# Patient Record
Sex: Male | Born: 1935 | Race: Black or African American | Hispanic: No | Smoking: Current every day smoker
Health system: Southern US, Community
[De-identification: ages and names within clinical notes are randomized; demographics above are authoritative.]

## PROBLEM LIST (undated history)

## (undated) DIAGNOSIS — N400 Enlarged prostate without lower urinary tract symptoms: Secondary | ICD-10-CM

## (undated) DIAGNOSIS — E785 Hyperlipidemia, unspecified: Secondary | ICD-10-CM

## (undated) DIAGNOSIS — J449 Chronic obstructive pulmonary disease, unspecified: Secondary | ICD-10-CM

## (undated) DIAGNOSIS — F0390 Unspecified dementia without behavioral disturbance: Secondary | ICD-10-CM

## (undated) DIAGNOSIS — I10 Essential (primary) hypertension: Secondary | ICD-10-CM

## (undated) HISTORY — DX: Benign prostatic hyperplasia without lower urinary tract symptoms: N40.0

## (undated) HISTORY — DX: Chronic obstructive pulmonary disease, unspecified: J44.9

## (undated) HISTORY — DX: Essential (primary) hypertension: I10

## (undated) HISTORY — DX: Hyperlipidemia, unspecified: E78.5

---

## 1999-02-24 ENCOUNTER — Encounter: Payer: Self-pay | Admitting: Emergency Medicine

## 1999-02-24 ENCOUNTER — Emergency Department (HOSPITAL_COMMUNITY): Admission: EM | Admit: 1999-02-24 | Discharge: 1999-02-24 | Payer: Self-pay | Admitting: Emergency Medicine

## 1999-12-24 ENCOUNTER — Emergency Department (HOSPITAL_COMMUNITY): Admission: EM | Admit: 1999-12-24 | Discharge: 1999-12-24 | Payer: Self-pay | Admitting: Emergency Medicine

## 1999-12-24 ENCOUNTER — Encounter: Payer: Self-pay | Admitting: Emergency Medicine

## 2000-12-25 ENCOUNTER — Encounter: Admission: RE | Admit: 2000-12-25 | Discharge: 2000-12-25 | Payer: Self-pay | Admitting: Internal Medicine

## 2000-12-25 ENCOUNTER — Encounter: Payer: Self-pay | Admitting: Internal Medicine

## 2001-04-30 ENCOUNTER — Ambulatory Visit (HOSPITAL_COMMUNITY): Admission: RE | Admit: 2001-04-30 | Discharge: 2001-04-30 | Payer: Self-pay | Admitting: Gastroenterology

## 2001-04-30 ENCOUNTER — Encounter (INDEPENDENT_AMBULATORY_CARE_PROVIDER_SITE_OTHER): Payer: Self-pay | Admitting: Specialist

## 2004-01-20 ENCOUNTER — Ambulatory Visit (HOSPITAL_COMMUNITY): Admission: RE | Admit: 2004-01-20 | Discharge: 2004-01-20 | Payer: Self-pay | Admitting: Gastroenterology

## 2004-05-10 ENCOUNTER — Encounter: Admission: RE | Admit: 2004-05-10 | Discharge: 2004-05-10 | Payer: Self-pay | Admitting: Internal Medicine

## 2006-10-14 ENCOUNTER — Encounter: Admission: RE | Admit: 2006-10-14 | Discharge: 2006-10-14 | Payer: Self-pay | Admitting: Internal Medicine

## 2007-02-23 ENCOUNTER — Emergency Department (HOSPITAL_COMMUNITY): Admission: EM | Admit: 2007-02-23 | Discharge: 2007-02-23 | Payer: Self-pay | Admitting: Emergency Medicine

## 2008-09-06 ENCOUNTER — Emergency Department (HOSPITAL_COMMUNITY): Admission: EM | Admit: 2008-09-06 | Discharge: 2008-09-06 | Payer: Self-pay | Admitting: Emergency Medicine

## 2010-09-20 LAB — BASIC METABOLIC PANEL WITH GFR
BUN: 16 mg/dL (ref 6–23)
Calcium: 9.1 mg/dL (ref 8.4–10.5)
Chloride: 108 meq/L (ref 96–112)
Creatinine, Ser: 1.04 mg/dL (ref 0.4–1.5)
GFR calc Af Amer: >60 mL/min (ref >60)
GFR calc non Af Amer: >60 mL/min (ref >60)

## 2010-09-20 LAB — CBC
HCT: 46.2 % (ref 39.0–52.0)
Hemoglobin: 15.6 g/dL (ref 13.0–17.0)
MCHC: 33.8 g/dL (ref 30.0–36.0)
MCV: 92.9 fL (ref 78.0–100.0)
Platelets: 146 K/uL — ABNORMAL LOW (ref 150–400)
RBC: 4.97 MIL/uL (ref 4.22–5.81)
RDW: 14.1 % (ref 11.5–15.5)
WBC: 4.6 K/uL (ref 4.0–10.5)

## 2010-09-20 LAB — BASIC METABOLIC PANEL
CO2: 25 mEq/L (ref 19–32)
Glucose, Bld: 106 mg/dL — ABNORMAL HIGH (ref 70–99)
Potassium: 3.7 mEq/L (ref 3.5–5.1)
Sodium: 139 mEq/L (ref 135–145)

## 2010-09-20 LAB — DIFFERENTIAL
Basophils Absolute: 0 10*3/uL (ref 0.0–0.1)
Basophils Relative: 1 % (ref 0–1)
Eosinophils Absolute: 0.2 K/uL (ref 0.0–0.7)
Eosinophils Relative: 5 % (ref 0–5)
Lymphocytes Relative: 35 % (ref 12–46)
Lymphs Abs: 1.6 K/uL (ref 0.7–4.0)
Monocytes Absolute: 0.2 10*3/uL (ref 0.1–1.0)
Monocytes Relative: 5 % (ref 3–12)
Neutro Abs: 2.5 K/uL (ref 1.7–7.7)
Neutrophils Relative %: 54 % (ref 43–77)

## 2010-10-26 NOTE — Procedures (Signed)
[facility]  Patient:    Clarence Padilla, Clarence Padilla Visit Number: [id number] MRN: [id number]          Service Type: END Location: ENDO Attending Physician:  Rich Brave Dictated by:   Florencia Reasons, M.D. Proc. Date: 04/30/01 Admit Date:  04/30/2001   CC:         Eric L. August Saucer, M.D.   Procedure Report  PROCEDURE:  Colonoscopy with polypectomy.  SURGEON:  Florencia Reasons, M.D.  INDICATIONS:  A 77 year old for colon cancer screening.  FINDINGS:  Multiple polyps removed.  DESCRIPTION OF PROCEDURE:  The nature, purpose, and risks of the procedure had been discussed with the patient who provided written consent.  Sedation was fentanyl 75 mcg and Versed 9 mg IV without arrhythmias or desaturation. Digital exam of the prostate was unremarkable.  The Olympus adult video colonoscope was easily advanced to the cecum as identified by visualization of the ileocecal valve and the absence of further lumen.  Pull back was then performed.  The quality of the prep was very good except for some adherent stool film in the cecum and proximal ascending colon which is not felt to have obscured any lesions.  Retroflexion was not performed in the rectum.  No colitis, vascular malformations, diverticular disease, or evidence of colon cancer were observed during this exam.  Six polyps were identified and removed during this exam.  They are as follows; a 6 x 8 mm semi-pedunculated polyp snared at the base of the cecum, a 6 x 6 mm semi-pedunculated polyp snared in the proximal ascending colon just above the cecum, a 1.8 cm polyp injected at its base and then snared in the mid transverse colon, a 5 mm semi-pedunculated polyp snared near the splenic flexure, a 3 mm polyp biopsied at about 35 cm or so, and a 3 mm polyp biopsied at 20 cm.  At all polypectomy sites, there was very good or complete hemostasis, and no evidence of excessive cautery.  The patient tolerated  the procedure well and there were no apparent complications.  IMPRESSION:  Multiple colon polyps, removed as described above.  PLAN:  Await pathology.  In view of the large number of polyps and the fact that one of them was fairly large, I would favor earlier-than-usual colonoscopic followup, probably two years from now. Dictated by:   Florencia Reasons, M.D. Attending Physician:  Rich Brave DD:  04/30/01 TD:  05/01/01 Job: [id number]

## 2011-12-18 ENCOUNTER — Emergency Department (HOSPITAL_COMMUNITY)
Admission: EM | Admit: 2011-12-18 | Discharge: 2011-12-18 | Disposition: A | Discharge status: Home / Self Care | Payer: Medicare Other | Attending: Emergency Medicine | Admitting: Emergency Medicine

## 2011-12-18 ENCOUNTER — Encounter (HOSPITAL_COMMUNITY): Payer: Self-pay

## 2011-12-18 DIAGNOSIS — L720 Epidermal cyst: Secondary | ICD-10-CM

## 2011-12-18 DIAGNOSIS — L723 Sebaceous cyst: Secondary | ICD-10-CM | POA: Insufficient documentation

## 2011-12-18 NOTE — ED Notes (Signed)
Pt here for abscess to back with head noted to same, sts hx of same and at home burst but now has came back.

## 2011-12-18 NOTE — ED Provider Notes (Signed)
History    This chart was scribed for Clarence Cellar, MD, MD by Smitty Pluck. The patient was seen in room TR11C and the patient's care was started at 1:37PM.   CSN: [id number]  Arrival date & time 12/18/11  1117   First MD Initiated Contact with Patient 12/18/11 1324      Chief Complaint  Patient presents with  . Abscess    (Consider location/radiation/quality/duration/timing/severity/associated sxs/prior treatment) Patient is a 78 y.o. male presenting with abscess. The history is provided by the patient.  Abscess    MAKAI DUMOND is a 78 y.o. male who presents to the Emergency Department complaining of moderate cyst  that has been there for years. Pt reports that if he does not have pain. He states that the abscess is irritated when he sit back in car seat. He reports it itches. Denies fevers, chills, n/v/d. Denies any current pain. Denies any other symptoms. Pt reports that he noticed it has gotten bigger 2-3 weeks ago "and looks like it's coming to a head". Reports PCP told him to come to ED if he had any problems with abscess. Reports that he has had PCP drain it in the past but it has come back. Denies radiation.  PCP is Dr. Willey Blade     Joice Lofts, RN 12/18/2011 11:26    Pt here for abscess to back with head noted to same, sts hx of same and at home burst but now has came back.    History reviewed. No pertinent past medical history.  No past surgical history on file.  No family history on file.  History  Substance Use Topics  . Smoking status: Not on file  . Smokeless tobacco: Not on file  . Alcohol Use: Not on file      Review of Systems  All other systems reviewed and are negative.  10 Systems reviewed and all are negative for acute change except as noted in the HPI.    Allergies  Review of patient's allergies indicates no known allergies.  Home Medications   Current Outpatient Rx  Name Route Sig Dispense Refill  . LOVASTATIN 40 MG PO TABS Oral  Take 40 mg by mouth at bedtime.    . TAMSULOSIN HCL 0.4 MG PO CAPS Oral Take 0.4 mg by mouth daily.      BP 123/67  Pulse 96  Temp 97.4 F (36.3 C) (Oral)  Resp 16  SpO2 99%  Physical Exam  Nursing note and vitals reviewed. Constitutional: He is oriented to person, place, and time. He appears well-developed and well-nourished. No distress.  HENT:  Head: Normocephalic and atraumatic.  Eyes: Conjunctivae are normal.  Cardiovascular: Normal rate, regular rhythm and normal heart sounds.   Pulmonary/Chest: Effort normal and breath sounds normal. No respiratory distress.  Abdominal: Soft. He exhibits no distension. There is no tenderness.  Neurological: He is alert and oriented to person, place, and time.  Skin: Skin is warm and dry.       5x3 cm cyst on back   minimal erythema  no induration No ttp Small pustule without drainage  Psychiatric: He has a normal mood and affect. His behavior is normal.    ED Course  Procedures (including critical care time) DIAGNOSTIC STUDIES: Oxygen Saturation is 99% on room air, normal by my interpretation.    COORDINATION OF CARE:  Labs Reviewed - No data to display No results found.   1. Cyst of skin and subcutaneous tissue  MDM  Back cyst v/s lipoma. No signs of cellulitis/abscess including no fever, induration, NO TTP (or pain). Pt was, however given option for I&D and he declined. Will f/u with his PMD, Dr. August Saucer. No EMC precluding discharge at this time. Given Precautions for return. PMD f/u.   I personally performed the services described in this documentation, which was scribed in my presence. The recorded information has been reviewed and considered.     Clarence Cellar, MD 12/18/11 1408

## 2012-02-17 LAB — HEPATIC FUNCTION PANEL
ALT: 12 U/L (ref 10–40)
Alkaline Phosphatase: 64 U/L (ref 25–125)

## 2012-02-17 LAB — BASIC METABOLIC PANEL
Creatinine: 0.9 mg/dL (ref 0.6–1.3)
Potassium: 4.2 mmol/L (ref 3.4–5.3)

## 2012-07-15 DIAGNOSIS — E785 Hyperlipidemia, unspecified: Secondary | ICD-10-CM

## 2012-07-15 HISTORY — DX: Hyperlipidemia, unspecified: E78.5

## 2012-08-10 ENCOUNTER — Encounter: Payer: Self-pay | Admitting: Hematology

## 2012-08-10 DIAGNOSIS — E785 Hyperlipidemia, unspecified: Secondary | ICD-10-CM | POA: Insufficient documentation

## 2013-04-14 ENCOUNTER — Other Ambulatory Visit: Payer: Self-pay | Admitting: Ophthalmology

## 2013-04-14 NOTE — H&P (Signed)
Patient Record  Ferris, Amin  Patient Number:  [id number] Date of Birth:  July 26, 1933 Age:  79 years old    Gender:  male Date of Evaluation:  April 14, 2013  Chief Complaint:   The patient is referred for consultataive evaluation regarding Cataracts OS>OD. History of Present Illness:   80 yo male discovered x 1 week ago that he could not see from the OS.  Has difficulty seeing the television.  Also has difficulty seeing to drive.  Not able to see to drive at night.  Has itching os, but no pain.  Has difficulty for distgance and near OS. ( Reviewed by Doctor: GG) Past History:  Allergies:  None., Active Medications:   Other Medications:  None. Birth History:  none Past Ocular History:  none Past Medical History:   Elevated Cholesterol Past Surgical History:   hernia repair  Family History:  no amblyopia, no blindness, no cataracts, + crossed eyes (sister), no diabetic retinopathy, no glaucoma, no macular degeneration, no retinal detachment, + cancer (sister (2), brother (2)), no diabetes, + heart disease (brother), + high blood pressure (brother), no stroke Social History:   Smoking Status: current every day smoker  Alcohol:  none   Driving status:  driving Marital status:  married Review of Systems:   Eyes: + itching (left), + decreased vision  All other systems are negative.  Examination:  Visual Acuity:   Distance [facility] [place]:  OD: 20/200    OS: 20/400 IOP:  OD:  14     OS:  14    @ 04:02PM (Goldmann applanation) Manifest Refraction:    Sphere    Cyl Axis       VA         Add       VA Prism Base R:  -1.25                20/30                                  L: Cannot Refract because of dense Cataract Left Eye                                                       comments: 04/05/13 Will get glasses after surgery.  Confrontation visual field:  OU:  Normal  Motility:  OU:  Normal  Pupils:  OU:  Shape, size, direct and consensual reaction normal  Adnexa:  Preauricular  LN, lacrimal drainage, lacrimal glands, orbit normal  Eyelids:  R:  normal L:  inferior lid laxity with modest ectropion Conjunctiva: OU:  bulbar, palpebral normal  Cornea: OU:  epithelium, stroma, endothelium, tear film normal  Anterior Chamber: OU:  depth normal, no cell, no flare, 3+ deep / clear  Iris: OU:  normal Dilation:  OU: Tropicamide 1%/ N 2.5% @ 04:14PM  Lens:  OD:  2+ nuclear sclerosis, 3+ cortical cataract OS:  3+ nuclear sclerosis, 3+ cortical, 3+ posterior subcapsular cataract, 2+ anterior subcapsular Cataract  Vitreous: OU:  normal  Optic Disc: OD:  cupping: 0.25   OS:  cupping: hazy view  Macula:  OD:  normal  OS:  poor view  Vessels:  OD:  normal  OS:  poor view  Periphery:  OD:    normal  OS:  poor view  A Scan / IOLMaster:  AScan predicts +21.00 Diopter Acrysof MA50BM PC IOL OS for emmetropia   Orientation to person, place and time:  Normal  Mood and affect:  Normal  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 We discussed the related symptoms , visual significance and when we intervene with surgery. We discussed the surgical techniques used, risks and benefits of surgery.  He has both an anterior and posterior subcapsulr Cataract OS which accounts for the poor fundus view. I have recommended proceeding with Phaco IOL OS first.  He indicated understanding our discussion and felt that his questions had been answered to his satisfaction.  He indicates that he desires to proceed with Pahco IOL OS.  Patient Instructions: Please do not eat anything after mignight the day before surgery.  Return to clinic:  December 4th, 2014 for post-operative follow-up   Schedule:  Phacoemulsification, Posterior Chamber Intraocular Lens Left Eye x 05/12/2013   (electronically signed)  Santonio Speakman, MD   

## 2013-05-11 ENCOUNTER — Other Ambulatory Visit (HOSPITAL_COMMUNITY): Payer: Self-pay | Admitting: *Deleted

## 2013-05-11 MED ORDER — CYCLOPENTOLATE HCL 1 % OP SOLN
1.0000 [drp] | OPHTHALMIC | Status: AC | PRN
  Administered 2013-05-12 (×3): 1 [drp] via OPHTHALMIC
  Filled 2013-05-11: qty 6

## 2013-05-11 MED ORDER — PHENYLEPHRINE HCL 2.5 % OP SOLN
1.0000 [drp] | OPHTHALMIC | Status: AC | PRN
  Administered 2013-05-12 (×3): 1 [drp] via OPHTHALMIC

## 2013-05-11 MED ORDER — GATIFLOXACIN 0.5 % OP SOLN
1.0000 [drp] | OPHTHALMIC | Status: AC | PRN
  Administered 2013-05-12 (×3): 1 [drp] via OPHTHALMIC
  Filled 2013-05-11: qty 7.5

## 2013-05-11 MED ORDER — TETRACAINE HCL 0.5 % OP SOLN
2.0000 [drp] | OPHTHALMIC | Status: AC
  Administered 2013-05-12: 2 [drp] via OPHTHALMIC
  Filled 2013-05-11: qty 4

## 2013-05-11 MED ORDER — PREDNISOLONE ACETATE 1 % OP SUSP
1.0000 [drp] | OPHTHALMIC | Status: AC
  Administered 2013-05-12: 09:00:00 via OPHTHALMIC
  Administered 2013-05-12: 1 [drp] via OPHTHALMIC
  Filled 2013-05-11: qty 5

## 2013-05-12 ENCOUNTER — Ambulatory Visit (HOSPITAL_COMMUNITY)
Admission: RE | Admit: 2013-05-12 | Discharge: 2013-05-12 | Disposition: A | Discharge status: Home / Self Care | Payer: Medicare Other | Source: Ambulatory Visit | Attending: Ophthalmology | Admitting: Ophthalmology

## 2013-05-12 ENCOUNTER — Ambulatory Visit (HOSPITAL_COMMUNITY): Payer: Medicare Other

## 2013-05-12 ENCOUNTER — Ambulatory Visit (HOSPITAL_COMMUNITY): Payer: Medicare Other | Admitting: Anesthesiology

## 2013-05-12 ENCOUNTER — Encounter (HOSPITAL_COMMUNITY): Payer: Medicare Other | Admitting: Anesthesiology

## 2013-05-12 ENCOUNTER — Encounter (HOSPITAL_COMMUNITY)
Admission: RE | Disposition: A | Discharge status: Home / Self Care | Payer: Self-pay | Source: Ambulatory Visit | Attending: Ophthalmology

## 2013-05-12 ENCOUNTER — Encounter (HOSPITAL_COMMUNITY): Payer: Self-pay | Admitting: Anesthesiology

## 2013-05-12 DIAGNOSIS — F17200 Nicotine dependence, unspecified, uncomplicated: Secondary | ICD-10-CM | POA: Insufficient documentation

## 2013-05-12 DIAGNOSIS — J449 Chronic obstructive pulmonary disease, unspecified: Secondary | ICD-10-CM | POA: Insufficient documentation

## 2013-05-12 DIAGNOSIS — J4489 Other specified chronic obstructive pulmonary disease: Secondary | ICD-10-CM | POA: Insufficient documentation

## 2013-05-12 DIAGNOSIS — H25049 Posterior subcapsular polar age-related cataract, unspecified eye: Secondary | ICD-10-CM | POA: Insufficient documentation

## 2013-05-12 DIAGNOSIS — E7800 Pure hypercholesterolemia, unspecified: Secondary | ICD-10-CM | POA: Insufficient documentation

## 2013-05-12 DIAGNOSIS — H25039 Anterior subcapsular polar age-related cataract, unspecified eye: Secondary | ICD-10-CM | POA: Insufficient documentation

## 2013-05-12 DIAGNOSIS — IMO0002 Reserved for concepts with insufficient information to code with codable children: Secondary | ICD-10-CM | POA: Insufficient documentation

## 2013-05-12 DIAGNOSIS — I10 Essential (primary) hypertension: Secondary | ICD-10-CM | POA: Insufficient documentation

## 2013-05-12 DIAGNOSIS — H2510 Age-related nuclear cataract, unspecified eye: Secondary | ICD-10-CM | POA: Insufficient documentation

## 2013-05-12 HISTORY — PX: CATARACT EXTRACTION W/PHACO: SHX586

## 2013-05-12 LAB — CBC
HCT: 47.2 % (ref 39.0–52.0)
Hemoglobin: 16.3 g/dL (ref 13.0–17.0)
MCH: 31.7 pg (ref 26.0–34.0)
MCHC: 34.5 g/dL (ref 30.0–36.0)
WBC: 6.8 10*3/uL (ref 4.0–10.5)

## 2013-05-12 SURGERY — PHACOEMULSIFICATION, CATARACT, WITH IOL INSERTION
Anesthesia: Monitor Anesthesia Care | Site: Eye | Laterality: Left

## 2013-05-12 MED ORDER — DEXAMETHASONE SODIUM PHOSPHATE 10 MG/ML IJ SOLN
INTRAMUSCULAR | Status: AC
  Filled 2013-05-12: qty 1

## 2013-05-12 MED ORDER — LIDOCAINE HCL 2 % IJ SOLN
INTRAMUSCULAR | Status: AC
  Filled 2013-05-12: qty 20

## 2013-05-12 MED ORDER — BUPIVACAINE HCL (PF) 0.75 % IJ SOLN
INTRAMUSCULAR | Status: AC
  Filled 2013-05-12: qty 10

## 2013-05-12 MED ORDER — EPINEPHRINE HCL 1 MG/ML IJ SOLN
INTRAMUSCULAR | Status: AC
  Filled 2013-05-12: qty 1

## 2013-05-12 MED ORDER — HYPROMELLOSE (GONIOSCOPIC) 2.5 % OP SOLN
OPHTHALMIC | Status: DC | PRN
  Administered 2013-05-12: 2 [drp] via OPHTHALMIC

## 2013-05-12 MED ORDER — LIDOCAINE HCL 2 % IJ SOLN
INTRAMUSCULAR | Status: DC | PRN
  Administered 2013-05-12: 11:00:00 via RETROBULBAR

## 2013-05-12 MED ORDER — TRYPAN BLUE 0.15 % OP SOLN
OPHTHALMIC | Status: DC | PRN
  Administered 2013-05-12: 0.5 mL via INTRAVITREAL

## 2013-05-12 MED ORDER — NA CHONDROIT SULF-NA HYALURON 40-30 MG/ML IO SOLN
INTRAOCULAR | Status: AC
  Filled 2013-05-12: qty 0.5

## 2013-05-12 MED ORDER — PHENYLEPHRINE HCL 2.5 % OP SOLN
OPHTHALMIC | Status: AC
  Filled 2013-05-12: qty 15

## 2013-05-12 MED ORDER — CEFAZOLIN SUBCONJUNCTIVAL INJECTION 100 MG/0.5 ML
INJECTION | SUBCONJUNCTIVAL | Status: DC | PRN
  Administered 2013-05-12: 100 mg via SUBCONJUNCTIVAL

## 2013-05-12 MED ORDER — DEXAMETHASONE SODIUM PHOSPHATE 10 MG/ML IJ SOLN
INTRAMUSCULAR | Status: DC | PRN
  Administered 2013-05-12: 10 mg

## 2013-05-12 MED ORDER — PROVISC 10 MG/ML IO SOLN
INTRAOCULAR | Status: DC | PRN
  Administered 2013-05-12: 0.85 mL via INTRAOCULAR

## 2013-05-12 MED ORDER — SODIUM CHLORIDE 0.9 % IV SOLN
INTRAVENOUS | Status: DC | PRN
  Administered 2013-05-12: 09:00:00 via INTRAVENOUS

## 2013-05-12 MED ORDER — HYPROMELLOSE (GONIOSCOPIC) 2.5 % OP SOLN
OPHTHALMIC | Status: AC
  Filled 2013-05-12: qty 15

## 2013-05-12 MED ORDER — EPINEPHRINE HCL 1 MG/ML IJ SOLN
INTRAOCULAR | Status: DC | PRN
  Administered 2013-05-12: 11:00:00

## 2013-05-12 MED ORDER — PROPOFOL 10 MG/ML IV BOLUS
INTRAVENOUS | Status: DC | PRN
  Administered 2013-05-12: 50 mg via INTRAVENOUS
  Administered 2013-05-12: 10 mg via INTRAVENOUS

## 2013-05-12 MED ORDER — SODIUM HYALURONATE 10 MG/ML IO SOLN
INTRAOCULAR | Status: AC
  Filled 2013-05-12: qty 0.85

## 2013-05-12 MED ORDER — NA CHONDROIT SULF-NA HYALURON 40-30 MG/ML IO SOLN
INTRAOCULAR | Status: DC | PRN
  Administered 2013-05-12: 0.5 mL via INTRAOCULAR

## 2013-05-12 MED ORDER — BACITRACIN-POLYMYXIN B 500-10000 UNIT/GM OP OINT
TOPICAL_OINTMENT | OPHTHALMIC | Status: DC | PRN
  Administered 2013-05-12: 1 via OPHTHALMIC

## 2013-05-12 MED ORDER — CEFAZOLIN SUBCONJUNCTIVAL INJECTION 100 MG/0.5 ML
100.0000 mg | INJECTION | SUBCONJUNCTIVAL | Status: DC
  Filled 2013-05-12: qty 0.5

## 2013-05-12 MED ORDER — SODIUM CHLORIDE 0.9 % IV SOLN
INTRAVENOUS | Status: DC
  Administered 2013-05-12: 10:00:00 via INTRAVENOUS

## 2013-05-12 MED ORDER — BSS IO SOLN
INTRAOCULAR | Status: AC
  Filled 2013-05-12: qty 500

## 2013-05-12 MED ORDER — BACITRACIN-POLYMYXIN B 500-10000 UNIT/GM OP OINT
TOPICAL_OINTMENT | OPHTHALMIC | Status: AC
  Filled 2013-05-12: qty 3.5

## 2013-05-12 MED ORDER — 0.9 % SODIUM CHLORIDE (POUR BTL) OPTIME
TOPICAL | Status: DC | PRN
  Administered 2013-05-12: 1000 mL

## 2013-05-12 MED ORDER — ONDANSETRON HCL 4 MG/2ML IJ SOLN
INTRAMUSCULAR | Status: DC | PRN
  Administered 2013-05-12: 4 mg via INTRAVENOUS

## 2013-05-12 MED ORDER — TRYPAN BLUE 0.15 % OP SOLN
0.5000 mL | OPHTHALMIC | Status: DC
  Filled 2013-05-12: qty 0.5

## 2013-05-12 SURGICAL SUPPLY — 54 items
APL SRG 3 HI ABS STRL LF PLS (MISCELLANEOUS) ×1
APPLICATOR COTTON TIP 6IN STRL (MISCELLANEOUS) ×2 IMPLANT
APPLICATOR DR MATTHEWS STRL (MISCELLANEOUS) ×2 IMPLANT
BLADE KERATOME 2.75 (BLADE) ×2 IMPLANT
BLADE STAB KNIFE 15DEG (BLADE) IMPLANT
COVER MAYO STAND STRL (DRAPES) ×2 IMPLANT
DRAPE OPHTHALMIC 77X100 STRL (CUSTOM PROCEDURE TRAY) ×2 IMPLANT
DRAPE POUCH INSTRU U-SHP 10X18 (DRAPES) ×2 IMPLANT
DRSG TEGADERM 4X4.75 (GAUZE/BANDAGES/DRESSINGS) ×3 IMPLANT
FILTER BLUE MILLIPORE (MISCELLANEOUS) IMPLANT
GLOVE ORTHO TXT STRL SZ7.5 (GLOVE) ×1 IMPLANT
GLOVE SS BIOGEL STRL SZ 6.5 (GLOVE) ×1 IMPLANT
GLOVE SUPERSENSE BIOGEL SZ 6.5 (GLOVE) ×1
GLOVE SURG SS PI 7.0 STRL IVOR (GLOVE) ×1 IMPLANT
GOWN SRG XL XLNG 56XLVL 4 (GOWN DISPOSABLE) ×1 IMPLANT
GOWN STRL NON-REIN LRG LVL3 (GOWN DISPOSABLE) ×2 IMPLANT
GOWN STRL NON-REIN XL XLG LVL4 (GOWN DISPOSABLE) ×2
KIT BASIN OR (CUSTOM PROCEDURE TRAY) ×2 IMPLANT
KIT ROOM TURNOVER OR (KITS) ×2 IMPLANT
KNIFE GRIESHABER SHARP 2.5MM (MISCELLANEOUS) ×2 IMPLANT
LENS IOL ACRYSOF MP POST 21.0 (Intraocular Lens) ×1 IMPLANT
MASK EYE SHIELD (GAUZE/BANDAGES/DRESSINGS) ×1 IMPLANT
NDL 25GX 5/8IN NON SAFETY (NEEDLE) ×2 IMPLANT
NDL FILTER BLUNT 18X1 1/2 (NEEDLE) IMPLANT
NDL HYPO 30X.5 LL (NEEDLE) ×2 IMPLANT
NEEDLE 22X1 1/2 (OR ONLY) (NEEDLE) IMPLANT
NEEDLE 25GX 5/8IN NON SAFETY (NEEDLE) ×4 IMPLANT
NEEDLE FILTER BLUNT 18X 1/2SAF (NEEDLE)
NEEDLE FILTER BLUNT 18X1 1/2 (NEEDLE) IMPLANT
NEEDLE HYPO 30X.5 LL (NEEDLE) ×4 IMPLANT
NS IRRIG 1000ML POUR BTL (IV SOLUTION) ×2 IMPLANT
PACK CATARACT CUSTOM (CUSTOM PROCEDURE TRAY) ×2 IMPLANT
PAD ARMBOARD 7.5X6 YLW CONV (MISCELLANEOUS) ×2 IMPLANT
PAD EYE OVAL STERILE LF (GAUZE/BANDAGES/DRESSINGS) ×1 IMPLANT
PAK PIK CVS CATARACT (OPHTHALMIC) ×2 IMPLANT
PROBE ANTERIOR 20G W/INFUS NDL (MISCELLANEOUS) IMPLANT
SHUTTLE MONARCH TYPE A (NEEDLE) ×2 IMPLANT
SPEAR EYE SURG WECK-CEL (MISCELLANEOUS) IMPLANT
SUT ETHILON 10-0 CS-B-6CS-B-6 (SUTURE)
SUT ETHILON 5 0 P 3 18 (SUTURE)
SUT ETHILON 9 0 TG140 8 (SUTURE) IMPLANT
SUT NYLON ETHILON 5-0 P-3 1X18 (SUTURE) IMPLANT
SUT PLAIN 6 0 TG1408 (SUTURE) IMPLANT
SUT POLY NON ABSORB 10-0 8 STR (SUTURE) IMPLANT
SUT VICRYL 6 0 S 29 12 (SUTURE) IMPLANT
SUTURE EHLN 10-0 CS-B-6CS-B-6 (SUTURE) IMPLANT
SYR 20CC LL (SYRINGE) IMPLANT
SYR TB 1ML LUER SLIP (SYRINGE) IMPLANT
SYRINGE 10CC LL (SYRINGE) IMPLANT
TIP ABS 45DEG FLARED 0.9MM (TIP) ×2 IMPLANT
TOWEL OR 17X24 6PK STRL BLUE (TOWEL DISPOSABLE) ×4 IMPLANT
WATER STERILE IRR 1000ML POUR (IV SOLUTION) ×2 IMPLANT
WIPE INSTRUMENT ADHESIVE BACK (MISCELLANEOUS) ×2 IMPLANT
WIPE INSTRUMENT VISIWIPE 73X73 (MISCELLANEOUS) ×2 IMPLANT

## 2013-05-12 NOTE — Interval H&P Note (Signed)
History and Physical Interval Note:  05/12/2013 10:23 AM  Clarence Padilla  has presented today for surgery, with the diagnosis of Combined Cataract Left Eye  The various methods of treatment have been discussed with the patient and family. After consideration of risks, benefits and other options for treatment, the patient has consented to  Procedure(s): CATARACT EXTRACTION PHACO AND INTRAOCULAR LENS PLACEMENT (IOC) (Left) as a surgical intervention .  The patient's history has been reviewed, patient examined, no change in status, stable for surgery.  I have reviewed the patient's chart and labs.  Questions were answered to the patient's satisfaction.     Keath Matera, Waynette Buttery

## 2013-05-12 NOTE — Anesthesia Procedure Notes (Signed)
Procedure Name: MAC Date/Time: 05/12/2013 10:46 AM Performed by: Sherie Don Pre-anesthesia Checklist: Patient identified, Emergency Drugs available, Suction available, Patient being monitored and Timeout performed Patient Re-evaluated:Patient Re-evaluated prior to inductionOxygen Delivery Method: Simple face mask Preoxygenation: Pre-oxygenation with 100% oxygen Intubation Type: IV induction

## 2013-05-12 NOTE — H&P (View-Only) (Signed)
Patient Record  Rakin, Lemelle  Patient Number:  [id number] Date of Birth:  July 16, 1933 Age:  79 years old    Gender:  male Date of Evaluation:  April 14, 2013  Chief Complaint:   The patient is referred for consultataive evaluation regarding Cataracts OS>OD. History of Present Illness:   79 yo male discovered x 1 week ago that he could not see from the OS.  Has difficulty seeing the television.  Also has difficulty seeing to drive.  Not able to see to drive at night.  Has itching os, but no pain.  Has difficulty for distgance and near OS. ( Reviewed by Doctor: GG) Past History:  Allergies:  None., Active Medications:   Other Medications:  None. Birth History:  none Past Ocular History:  none Past Medical History:   Elevated Cholesterol Past Surgical History:   hernia repair  Family History:  no amblyopia, no blindness, no cataracts, + crossed eyes (sister), no diabetic retinopathy, no glaucoma, no macular degeneration, no retinal detachment, + cancer (sister (2), brother (2)), no diabetes, + heart disease (brother), + high blood pressure (brother), no stroke Social History:   Smoking Status: current every day smoker  Alcohol:  none   Driving status:  driving Marital status:  married Review of Systems:   Eyes: + itching (left), + decreased vision  All other systems are negative.  Examination:  Visual Acuity:   Distance [facility] [place]:  OD: 20/200    OS: 20/400 IOP:  OD:  14     OS:  14    @ 04:02PM (Goldmann applanation) Manifest Refraction:    Sphere    Cyl Axis       VA         Add       VA Prism Base R:  -1.25                20/30                                  L: Cannot Refract because of dense Cataract Left Eye                                                       comments: 04/05/13 Will get glasses after surgery.  Confrontation visual field:  OU:  Normal  Motility:  OU:  Normal  Pupils:  OU:  Shape, size, direct and consensual reaction normal  Adnexa:  Preauricular  LN, lacrimal drainage, lacrimal glands, orbit normal  Eyelids:  R:  normal L:  inferior lid laxity with modest ectropion Conjunctiva: OU:  bulbar, palpebral normal  Cornea: OU:  epithelium, stroma, endothelium, tear film normal  Anterior Chamber: OU:  depth normal, no cell, no flare, 3+ deep / clear  Iris: OU:  normal Dilation:  OU: Tropicamide 1%/ N 2.5% @ 04:14PM  Lens:  OD:  2+ nuclear sclerosis, 3+ cortical cataract OS:  3+ nuclear sclerosis, 3+ cortical, 3+ posterior subcapsular cataract, 2+ anterior subcapsular Cataract  Vitreous: OU:  normal  Optic Disc: OD:  cupping: 0.25   OS:  cupping: hazy view  Macula:  OD:  normal  OS:  poor view  Vessels:  OD:  normal  OS:  poor view  Periphery:  OD:  normal  OS:  poor view  A Scan / IOLMaster:  AScan predicts +21.00 Diopter Acrysof MA50BM PC IOL OS for emmetropia   Orientation to person, place and time:  Normal  Mood and affect:  Normal  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 We discussed the related symptoms , visual significance and when we intervene with surgery. We discussed the surgical techniques used, risks and benefits of surgery.  He has both an anterior and posterior subcapsulr Cataract OS which accounts for the poor fundus view. I have recommended proceeding with Phaco IOL OS first.  He indicated understanding our discussion and felt that his questions had been answered to his satisfaction.  He indicates that he desires to proceed with Pahco IOL OS.  Patient Instructions: Please do not eat anything after mignight the day before surgery.  Return to clinic:  December 4th, 2014 for post-operative follow-up   Schedule:  Phacoemulsification, Posterior Chamber Intraocular Lens Left Eye x 05/12/2013   (electronically signed)  Shade Flood, MD

## 2013-05-12 NOTE — Transfer of Care (Signed)
Immediate Anesthesia Transfer of Care Note  Patient: Clarence Padilla  Procedure(s) Performed: Procedure(s): CATARACT EXTRACTION PHACO AND INTRAOCULAR LENS PLACEMENT (IOC) (Left)  Patient Location: PACU  Anesthesia Type:MAC  Level of Consciousness: awake, alert  and patient cooperative  Airway & Oxygen Therapy: Patient Spontanous Breathing  Post-op Assessment: Report given to PACU RN and Post -op Vital signs reviewed and stable  Post vital signs: Reviewed and stable  Complications: No apparent anesthesia complications

## 2013-05-12 NOTE — Anesthesia Postprocedure Evaluation (Signed)
Anesthesia Post Note  Patient: Clarence Padilla  Procedure(s) Performed: Procedure(s) (LRB): CATARACT EXTRACTION PHACO AND INTRAOCULAR LENS PLACEMENT (IOC) (Left)  Anesthesia type: MAC  Patient location: PACU  Post pain: Pain level controlled and Adequate analgesia  Post assessment: Post-op Vital signs reviewed, Patient's Cardiovascular Status Stable and Respiratory Function Stable  Last Vitals:  Filed Vitals:   05/12/13 0857  BP: 166/79  Pulse: 72  Temp: 36.4 C  Resp: 18    Post vital signs: Reviewed and stable  Level of consciousness: awake, alert  and oriented  Complications: No apparent anesthesia complications

## 2013-05-12 NOTE — Op Note (Signed)
Clarence Padilla 05/12/2013 Cataract: Combined, Nuclear  Procedure: Phacoemulsification, Posterior Chamber Intra-ocular Lens Operative Eye:  left eye  Surgeon: Shade Flood Estimated Blood Loss: minimal Specimens for Pathology:  None Complications: none  The patient was prepared and draped in the usual manner for ocular surgery on the left eye. A Cook lid speculum was placed. A peripheral clear corneal incision was made at the surgical limbus centered at the 11:00 meridian. A separate clear corneal stab incision was made with a 15 degree blade at the 2:00 meridian to permit bi-manual technique. Viscoat and  Provisc as an underlying layer next to the capsule was instilled into the anterior chamber through that incision.  A keratome was used to create a self sealing incision entering the anterior chamber at the 11:00 meridian. Trypan Blue was instilled into the anterior chamber to stain the capsule on his Mature Cataract . A capsulorhexis was performed using a bent 25g needle. The lens was hydropic and firmly adherent to the capsule. The lens was gently  hydrodissected using a Nichammin cannula. The lens was opaque so I could not see the fluid wave.  The Chang chopper was inserted and used to rotate the lens to insure adequate lens mobility. The phacoemulsification handpiece was inserted and a combined phaco-chop technique was employed, fracturing the lens into separate sections with subsequent removal with the phaco handpiece. I suspected a small piece of cortex fell into the posterior vitreous cavity as I saw a white reflex behind the posterior capsule. He does have known Macular Atrophic changes.   The I/A cannula was used to remove remaining lens cortex. Provisc was instilled and used to deepen the anterior chamber. Given that there may be a zonular dehissence, I elected to place the IOL in the sulcus and rotate it horizontally.  The Monarch injector was used to place a folded Acrysof MA50BM PC IOL, +  21.00  diopters, into the sulcus. A McPherson forcep  was used to place  the trailing haptic into the sulcus. The I/A cannula was used to remove viscoelastic from the [facility].   BSS was used to bring the IOP to the desired range and the wound was checked to insure it was watertight. Subconjunctival injections of Ancef 100/0.55ml and Dexamethasone 0.5 ml of a 10mg /51ml solution were placed without complication. The lid speculum and drapes were removed and the patient's eye was patched with Polymixin/Bacitracin ophthalmic ointment. An eye shield was placed and the patient was transferred alert and conversant from the operating room to the post-operative recovery area.   Shade Flood, MD

## 2013-05-12 NOTE — Anesthesia Preprocedure Evaluation (Signed)
Anesthesia Evaluation  Patient identified by MRN, date of birth, ID band Patient awake    Reviewed: Allergy & Precautions, H&P , NPO status , Patient's Chart, lab work & pertinent test results  Airway Mallampati: II  Neck ROM: full    Dental   Pulmonary COPDformer smoker,          Cardiovascular hypertension,     Neuro/Psych    GI/Hepatic   Endo/Other    Renal/GU      Musculoskeletal   Abdominal   Peds  Hematology   Anesthesia Other Findings   Reproductive/Obstetrics                           Anesthesia Physical Anesthesia Plan  ASA: II  Anesthesia Plan: MAC   Post-op Pain Management:    Induction: Intravenous  Airway Management Planned: Nasal Cannula  Additional Equipment:   Intra-op Plan:   Post-operative Plan:   Informed Consent: I have reviewed the patients History and Physical, chart, labs and discussed the procedure including the risks, benefits and alternatives for the proposed anesthesia with the patient or authorized representative who has indicated his/her understanding and acceptance.     Plan Discussed with: CRNA, Anesthesiologist and Surgeon  Anesthesia Plan Comments:         Anesthesia Quick Evaluation

## 2013-05-14 ENCOUNTER — Encounter (HOSPITAL_COMMUNITY): Payer: Self-pay | Admitting: Ophthalmology

## 2015-05-19 ENCOUNTER — Encounter (HOSPITAL_COMMUNITY): Payer: Self-pay | Admitting: *Deleted

## 2015-05-19 ENCOUNTER — Emergency Department (HOSPITAL_COMMUNITY)
Admission: EM | Admit: 2015-05-19 | Discharge: 2015-05-19 | Disposition: A | Discharge status: Home / Self Care | Payer: Medicare HMO | Attending: Emergency Medicine | Admitting: Emergency Medicine

## 2015-05-19 ENCOUNTER — Emergency Department (HOSPITAL_COMMUNITY): Payer: Medicare HMO

## 2015-05-19 DIAGNOSIS — I10 Essential (primary) hypertension: Secondary | ICD-10-CM | POA: Insufficient documentation

## 2015-05-19 DIAGNOSIS — J449 Chronic obstructive pulmonary disease, unspecified: Secondary | ICD-10-CM | POA: Insufficient documentation

## 2015-05-19 DIAGNOSIS — M5432 Sciatica, left side: Secondary | ICD-10-CM | POA: Insufficient documentation

## 2015-05-19 DIAGNOSIS — Z87438 Personal history of other diseases of male genital organs: Secondary | ICD-10-CM | POA: Insufficient documentation

## 2015-05-19 DIAGNOSIS — I998 Other disorder of circulatory system: Secondary | ICD-10-CM | POA: Insufficient documentation

## 2015-05-19 DIAGNOSIS — Z79899 Other long term (current) drug therapy: Secondary | ICD-10-CM | POA: Diagnosis not present

## 2015-05-19 DIAGNOSIS — R0989 Other specified symptoms and signs involving the circulatory and respiratory systems: Secondary | ICD-10-CM

## 2015-05-19 DIAGNOSIS — E785 Hyperlipidemia, unspecified: Secondary | ICD-10-CM | POA: Insufficient documentation

## 2015-05-19 DIAGNOSIS — M79605 Pain in left leg: Secondary | ICD-10-CM | POA: Diagnosis present

## 2015-05-19 MED ORDER — NAPROXEN 375 MG PO TABS: 375.0000 mg | ORAL_TABLET | Freq: Two times a day (BID) | ORAL | Status: DC

## 2015-05-19 NOTE — ED Provider Notes (Signed)
CSN: [id number]     Arrival date & time 05/19/15  4098 History   First MD Initiated Contact with Patient 05/19/15 [phone]     Chief Complaint  Patient presents with  . Leg Pain     (Consider location/radiation/quality/duration/timing/severity/associated sxs/prior Treatment) HPI  KEVONTAY BURKS is a(n) 81 y.o. male who presents to the ED with cc of left leg pain. He states that he has had ongoing sxs of Left sciatica for the last 8 months. Usually relieved with ibuprofen. Patient states that today his left leg gave out and he fell forward hitting his nose on the sink. He had mild epistaxis prior to evaluation which has resolved. Denies  loss of bowel/bladder function or saddle anesthesia. Denies neck stiffness, headache, rash.  Denies fever or recent procedures to back. He denies history of Arthritis or back pain. He was able to ambulate after the event. He denies LOC, facial pain.    Past Medical History  Diagnosis Date  . Hypertension   . COPD (chronic obstructive pulmonary disease) (HCC)   . Hyperlipidemia   . BPH (benign prostatic hyperplasia)   . Hyperlipidemia 07/15/12   Past Surgical History  Procedure Laterality Date  . Cataract extraction w/phaco Left 05/12/2013    Procedure: CATARACT EXTRACTION PHACO AND INTRAOCULAR LENS PLACEMENT (IOC);  Surgeon: Shade Flood, MD;  Location: [facility];  Service: Ophthalmology;  Laterality: Left;   History reviewed. No pertinent family history. Social History  Substance Use Topics  . Smoking status: Former Games developer  . Smokeless tobacco: None  . Alcohol Use: None    Review of Systems  Ten systems reviewed and are negative for acute change, except as noted in the HPI.    Allergies  Review of patient's allergies indicates no known allergies.  Home Medications   Prior to Admission medications   Medication Sig Start Date End Date Taking? Authorizing Provider  lovastatin (MEVACOR) 40 MG tablet Take 40 mg by mouth at bedtime.    Historical  Provider, MD  Tamsulosin HCl (FLOMAX) 0.4 MG CAPS Take 0.4 mg by mouth daily.    Historical Provider, MD   BP 137/79 mmHg  Pulse 65  Temp(Src) 97.7 F (36.5 C) (Oral)  Ht  (1.956 m)  Wt 63.957 kg  BMI 16.72 kg/m2  SpO2 100% Physical Exam  Constitutional: He appears well-developed and well-nourished. No distress.  HENT:  Head: Normocephalic and atraumatic.  Eyes: Conjunctivae are normal. No scleral icterus.  Neck: Normal range of motion. Neck supple.  Cardiovascular: Normal rate, regular rhythm and normal heart sounds.   BL monophasic dopplered PT pulses. No palpable or dopplered DP pulses, however feet are warm and well perfused/ Sensation is intact.  Pulmonary/Chest: Effort normal and breath sounds normal. No respiratory distress.  Abdominal: Soft. There is no tenderness.  Musculoskeletal: He exhibits no edema.  TTP Left sciatic notch. + straight leg raise at 45 degrees.  normal reflexes BL.    Neurological: He is alert.  Skin: Skin is warm and dry. He is not diaphoretic.  Psychiatric: His behavior is normal.  Nursing note and vitals reviewed.   ED Course  Procedures (including critical care time) Labs Review Labs Reviewed - No data to display  Imaging Review No results found. I have personally reviewed and evaluated these images and lab results as part of my medical decision-making.   EKG Interpretation None      MDM   Final diagnoses:  Sciatica of left side  Poor peripheral circulation (HCC)  patient with symptoms consistent with left sided sciatica syndrome. His imaging shows degenerative changes.  He has no red flag symptoms. Although his circulation is poor in none bilateral DP and PT pulses. He has well-perfused feet that are warm with good cap refill. I do not feel that he has any concern for acute arterial occlusion. I have discussed this with the attending physician , Dr.Nguyen,  And we will refer the patient to vascular surgery for further  workup of his poor peripheral circulation.  Patient is ambulatory in the emergency department without weakness..  For 2 or so for further workup of his sciatica. He appears safe for discharge at this time.    Arthor Captainbigail Kiano Terrien, PA-C 05/19/15 1143  Leta BaptistEmily Roe Nguyen, MD 05/20/15 [phone]

## 2015-05-19 NOTE — ED Notes (Signed)
Pt able to ambulate in a safe and steady manner

## 2015-05-19 NOTE — Discharge Instructions (Signed)
Sciatica °Sciatica is pain, weakness, numbness, or tingling along the path of the sciatic nerve. The nerve starts in the lower back and runs down the back of each leg. The nerve controls the muscles in the lower leg and in the back of the knee, while also providing sensation to the back of the thigh, lower leg, and the sole of your foot. Sciatica is a symptom of another medical condition. For instance, nerve damage or certain conditions, such as a herniated disk or bone spur on the spine, pinch or put pressure on the sciatic nerve. This causes the pain, weakness, or other sensations normally associated with sciatica. Generally, sciatica only affects one side of the body. °CAUSES  °· Herniated or slipped disc. °· Degenerative disk disease. °· A pain disorder involving the narrow muscle in the buttocks (piriformis syndrome). °· Pelvic injury or fracture. °· Pregnancy. °· Tumor (rare). °SYMPTOMS  °Symptoms can vary from mild to very severe. The symptoms usually travel from the low back to the buttocks and down the back of the leg. Symptoms can include: °· Mild tingling or dull aches in the lower back, leg, or hip. °· Numbness in the back of the calf or sole of the foot. °· Burning sensations in the lower back, leg, or hip. °· Sharp pains in the lower back, leg, or hip. °· Leg weakness. °· Severe back pain inhibiting movement. °These symptoms may get worse with coughing, sneezing, laughing, or prolonged sitting or standing. Also, being overweight may worsen symptoms. °DIAGNOSIS  °Your caregiver will perform a physical exam to look for common symptoms of sciatica. He or she may ask you to do certain movements or activities that would trigger sciatic nerve pain. Other tests may be performed to find the cause of the sciatica. These may include: °· Blood tests. °· X-rays. °· Imaging tests, such as an MRI or CT scan. °TREATMENT  °Treatment is directed at the cause of the sciatic pain. Sometimes, treatment is not necessary  and the pain and discomfort goes away on its own. If treatment is needed, your caregiver may suggest: °· Over-the-counter medicines to relieve pain. °· Prescription medicines, such as anti-inflammatory medicine, muscle relaxants, or narcotics. °· Applying heat or ice to the painful area. °· Steroid injections to lessen pain, irritation, and inflammation around the nerve. °· Reducing activity during periods of pain. °· Exercising and stretching to strengthen your abdomen and improve flexibility of your spine. Your caregiver may suggest losing weight if the extra weight makes the back pain worse. °· Physical therapy. °· Surgery to eliminate what is pressing or pinching the nerve, such as a bone spur or part of a herniated disk. °HOME CARE INSTRUCTIONS  °· Only take over-the-counter or prescription medicines for pain or discomfort as directed by your caregiver. °· Apply ice to the affected area for 20 minutes, 3-4 times a day for the first 48-72 hours. Then try heat in the same way. °· Exercise, stretch, or perform your usual activities if these do not aggravate your pain. °· Attend physical therapy sessions as directed by your caregiver. °· Keep all follow-up appointments as directed by your caregiver. °· Do not wear high heels or shoes that do not provide proper support. °· Check your mattress to see if it is too soft. A firm mattress may lessen your pain and discomfort. °SEEK IMMEDIATE MEDICAL CARE IF:  °· You lose control of your bowel or bladder (incontinence). °· You have increasing weakness in the lower back, pelvis, buttocks,   or legs.  You have redness or swelling of your back.  You have a burning sensation when you urinate.  You have pain that gets worse when you lie down or awakens you at night.  Your pain is worse than you have experienced in the past.  Your pain is lasting longer than 4 weeks.  You are suddenly losing weight without reason. MAKE SURE YOU:  Understand these  instructions.  Will watch your condition.  Will get help right away if you are not doing well or get worse.   This information is not intended to replace advice given to you by your health care provider. Make sure you discuss any questions you have with your health care provider.   Document Released: 05/21/2001 Document Revised: 02/15/2015 Document Reviewed: 10/06/2011 Elsevier Interactive Patient Education 2016 Elsevier Inc.  Peripheral Vascular Disease Peripheral vascular disease (PVD) is a disease of the blood vessels that are not part of your heart and brain. A simple term for PVD is poor circulation. In most cases, PVD narrows the blood vessels that carry blood from your heart to the rest of your body. This can result in a decreased supply of blood to your arms, legs, and internal organs, like your stomach or kidneys. However, it most often affects a person's lower legs and feet. There are two types of PVD.  Organic PVD. This is the more common type. It is caused by damage to the structure of blood vessels.  Functional PVD. This is caused by conditions that make blood vessels contract and tighten (spasm). Without treatment, PVD tends to get worse over time. PVD can also lead to acute ischemic limb. This is when an arm or limb suddenly has trouble getting enough blood. This is a medical emergency. CAUSES Each type of PVD has many different causes. The most common cause of PVD is buildup of a fatty material (plaque) inside of your arteries (atherosclerosis). Small amounts of plaque can break off from the walls of the blood vessels and become lodged in a smaller artery. This blocks blood flow and can cause acute ischemic limb. Other common causes of PVD include:  Blood clots that form inside of blood vessels.  Injuries to blood vessels.  Diseases that cause inflammation of blood vessels or cause blood vessel spasms.  Health behaviors and health history that increase your risk of  developing PVD. RISK FACTORS  You may have a greater risk of PVD if you:  Have a family history of PVD.  Have certain medical conditions, including:  High cholesterol.  Diabetes.  High blood pressure (hypertension).  Coronary heart disease.  Past problems with blood clots.  Past injury, such as burns or a broken bone. These may have damaged blood vessels in your limbs.  Buerger disease. This is caused by inflamed blood vessels in your hands and feet.  Some forms of arthritis.  Rare birth defects that affect the arteries in your legs.  Use tobacco.  Do not get enough exercise.  Are obese.  Are age 79 or older. SIGNS AND SYMPTOMS  PVD may cause many different symptoms. Your symptoms depend on what part of your body is not getting enough blood. Some common signs and symptoms include:  Cramps in your lower legs. This may be a symptom of poor leg circulation (claudication).  Pain and weakness in your legs while you are physically active that goes away when you rest (intermittent claudication).  Leg pain when at rest.  Leg numbness, tingling, or weakness.  Coldness in a leg or foot, especially when compared with the other leg.  Skin or hair changes. These can include:  Hair loss.  Shiny skin.  Pale or bluish skin.  Thick toenails.  Inability to get or maintain an erection (erectile dysfunction). People with PVD are more prone to developing ulcers and sores on their toes, feet, or legs. These may take longer than normal to heal. DIAGNOSIS Your health care provider may diagnose PVD from your signs and symptoms. The health care provider will also do a physical exam. You may have tests to find out what is causing your PVD and determine its severity. Tests may include:  Blood pressure recordings from your arms and legs and measurements of the strength of your pulses (pulse volume recordings).  Imaging studies using sound waves to take pictures of the blood flow  through your blood vessels (Doppler ultrasound).  Injecting a dye into your blood vessels before having imaging studies using:  X-rays (angiogram or arteriogram).  Computer-generated X-rays (CT angiogram).  A powerful electromagnetic field and a computer (magnetic resonance angiogram or MRA). TREATMENT Treatment for PVD depends on the cause of your condition and the severity of your symptoms. It also depends on your age. Underlying causes need to be treated and controlled. These include long-lasting (chronic) conditions, such as diabetes, high cholesterol, and high blood pressure. You may need to first try making lifestyle changes and taking medicines. Surgery may be needed if these do not work. Lifestyle changes may include:  Quitting smoking.  Exercising regularly.  Following a low-fat, low-cholesterol diet. Medicines may include:  Blood thinners to prevent blood clots.  Medicines to improve blood flow.  Medicines to improve your blood cholesterol levels. Surgical procedures may include:  A procedure that uses an inflated balloon to open a blocked artery and improve blood flow (angioplasty).  A procedure to put in a tube (stent) to keep a blocked artery open (stent implant).  Surgery to reroute blood flow around a blocked artery (peripheral bypass surgery).  Surgery to remove dead tissue from an infected wound on the affected limb.  Amputation. This is surgical removal of the affected limb. This may be necessary in cases of acute ischemic limb that are not improved through medical or surgical treatments. HOME CARE INSTRUCTIONS  Take medicines only as directed by your health care provider.  Do not use any tobacco products, including cigarettes, chewing tobacco, or electronic cigarettes. If you need help quitting, ask your health care provider.  Lose weight if you are overweight, and maintain a healthy weight as directed by your health care provider.  Eat a diet that is  low in fat and cholesterol. If you need help, ask your health care provider.  Exercise regularly. Ask your health care provider to suggest some good activities for you.  Use compression stockings or other mechanical devices as directed by your health care provider.  Take good care of your feet.  Wear comfortable shoes that fit well.  Check your feet often for any cuts or sores. SEEK MEDICAL CARE IF:  You have cramps in your legs while walking.  You have leg pain when you are at rest.  You have coldness in a leg or foot.  Your skin changes.  You have erectile dysfunction.  You have cuts or sores on your feet that are not healing. SEEK IMMEDIATE MEDICAL CARE IF:  Your arm or leg turns cold and blue.  Your arms or legs become red, warm, swollen, painful, or  numb.  You have chest pain or trouble breathing.  You suddenly have weakness in your face, arm, or leg.  You become very confused or lose the ability to speak.  You suddenly have a very bad headache or lose your vision.   This information is not intended to replace advice given to you by your health care provider. Make sure you discuss any questions you have with your health care provider.   Document Released: 07/04/2004 Document Revised: 06/17/2014 Document Reviewed: 11/04/2013 Elsevier Interactive Patient Education [employer]2016 Elsevier Inc.

## 2015-05-19 NOTE — ED Notes (Signed)
Pt states he has had left hip pain for approx 8 months and has been using heat and IBU with temporary relief.  Pt states he walked to the bathroom and his hip/upper leg gave out and he feel hitting his nose on sink causing temporary bleeding.  Bleeding is controlled at this time.  Pt states L hip pain 10/10.  No visible deformity noted.

## 2015-05-19 NOTE — ED Notes (Signed)
PA at bedside with doppler.   Peddle pulse absent bilateral, posterial peddle pulses L weak, r strong.

## 2015-10-12 ENCOUNTER — Ambulatory Visit
Admission: RE | Admit: 2015-10-12 | Discharge: 2015-10-12 | Disposition: A | Discharge status: Home / Self Care | Payer: Medicare HMO | Source: Ambulatory Visit | Attending: Internal Medicine | Admitting: Internal Medicine

## 2015-10-12 ENCOUNTER — Other Ambulatory Visit: Payer: Self-pay | Admitting: Internal Medicine

## 2015-10-12 DIAGNOSIS — R0989 Other specified symptoms and signs involving the circulatory and respiratory systems: Secondary | ICD-10-CM

## 2016-01-30 ENCOUNTER — Encounter (HOSPITAL_COMMUNITY): Payer: Self-pay | Admitting: Emergency Medicine

## 2016-01-30 ENCOUNTER — Emergency Department (HOSPITAL_COMMUNITY)
Admission: EM | Admit: 2016-01-30 | Discharge: 2016-01-30 | Disposition: A | Discharge status: Home / Self Care | Payer: Medicare HMO | Attending: Emergency Medicine | Admitting: Emergency Medicine

## 2016-01-30 DIAGNOSIS — I10 Essential (primary) hypertension: Secondary | ICD-10-CM | POA: Insufficient documentation

## 2016-01-30 DIAGNOSIS — K4090 Unilateral inguinal hernia, without obstruction or gangrene, not specified as recurrent: Secondary | ICD-10-CM

## 2016-01-30 DIAGNOSIS — R1030 Lower abdominal pain, unspecified: Secondary | ICD-10-CM | POA: Diagnosis present

## 2016-01-30 DIAGNOSIS — Z87891 Personal history of nicotine dependence: Secondary | ICD-10-CM | POA: Insufficient documentation

## 2016-01-30 DIAGNOSIS — J449 Chronic obstructive pulmonary disease, unspecified: Secondary | ICD-10-CM | POA: Insufficient documentation

## 2016-01-30 DIAGNOSIS — Z7982 Long term (current) use of aspirin: Secondary | ICD-10-CM | POA: Insufficient documentation

## 2016-01-30 LAB — URINALYSIS, ROUTINE W REFLEX MICROSCOPIC
Bilirubin Urine: NEGATIVE
Glucose, UA: NEGATIVE mg/dL
Hgb urine dipstick: NEGATIVE
Ketones, ur: NEGATIVE mg/dL
Nitrite: NEGATIVE
Protein, ur: 30 mg/dL — AB
Specific Gravity, Urine: 1.02 (ref 1.005–1.030)
pH: 6 (ref 5.0–8.0)

## 2016-01-30 LAB — URINE MICROSCOPIC-ADD ON

## 2016-01-30 MED ORDER — TRAMADOL HCL 50 MG PO TABS: 50.0000 mg | ORAL_TABLET | Freq: Four times a day (QID) | ORAL | 0 refills | Status: DC | PRN

## 2016-01-30 NOTE — ED Triage Notes (Signed)
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

## 2016-01-30 NOTE — Discharge Instructions (Signed)
YOUR HERNIA IS EASILY REDUCIBLE HERE WITH LITTLE EFFORT. IT IS IMPORTANT FOR YOU TO RETURN TO THE EMERGENCY DEPARTMENT IF YOUR PAIN BECOMES SEVERE, YOU DEVELOP A HIGH FEVER, OR THE BULGE RECURS AND DOES NOT EASILY REDUCE WITH PRESSURE.

## 2016-01-30 NOTE — ED Provider Notes (Signed)
[facility]-EMERGENCY DEPT Provider Note   CSN: [id number] Arrival date & time: 01/30/16  1630     History   Chief Complaint Chief Complaint  Patient presents with  . Groin Pain    HPI Clarence Padilla is a 82 y.o. male.  Patient with a history of previous inguinal hernia repair presents with swelling to left inguinal area x 1 day. He has noticed some soreness and mild swelling with significantly increased swelling today. No change in bowel movements, no difficulty urinating. No fever, N, V. No hematochezia. He denies scrotal/testicular pain.    The history is provided by the patient. No language interpreter was used.  Groin Pain  This is a recurrent problem. The problem occurs constantly. Associated symptoms include abdominal pain.    Past Medical History:  Diagnosis Date  . BPH (benign prostatic hyperplasia)   . COPD (chronic obstructive pulmonary disease) (HCC)   . Hyperlipidemia   . Hyperlipidemia 07/15/12  . Hypertension     Patient Active Problem List   Diagnosis Date Noted  . Hyperlipidemia     Past Surgical History:  Procedure Laterality Date  . CATARACT EXTRACTION W/PHACO Left 05/12/2013   Procedure: CATARACT EXTRACTION PHACO AND INTRAOCULAR LENS PLACEMENT (IOC);  Surgeon: Shade FloodGreer Geiger, MD;  [facility];  Service: Ophthalmology;  Laterality: Left;       Home Medications    Prior to Admission medications   Medication Sig Start Date End Date Taking? Authorizing Provider  aspirin 81 MG chewable tablet Chew 81 mg by mouth daily.    Historical Provider, MD  atropine 1 % ophthalmic solution Place 1 drop into the left eye 2 (two) times daily.    Historical Provider, MD  donepezil (ARICEPT ODT) 5 MG disintegrating tablet Take 5 mg by mouth daily. 04/27/15   Historical Provider, MD  fluorometholone (FML) 0.1 % ophthalmic suspension Place 1 drop into the left eye 4 (four) times daily. 02/22/15   Historical Provider, MD  lovastatin (MEVACOR) 40 MG tablet Take 40 mg by  mouth at bedtime.    Historical Provider, MD  naproxen (NAPROSYN) 375 MG tablet Take 1 tablet (375 mg total) by mouth 2 (two) times daily. 05/19/15   Arthor CaptainAbigail Harris, PA-C  prednisoLONE acetate (PRED FORTE) 1 % ophthalmic suspension Place 1 drop into the left eye 4 (four) times daily. 05/16/15   Historical Provider, MD  Tamsulosin HCl (FLOMAX) 0.4 MG CAPS Take 0.4 mg by mouth daily.    Historical Provider, MD    Family History History reviewed. No pertinent family history.  Social History Social History  Substance Use Topics  . Smoking status: Former Games developermoker  . Smokeless tobacco: Not on file  . Alcohol use Not on file     Allergies   Review of patient's allergies indicates no known allergies.   Review of Systems Review of Systems  Constitutional: Negative for fatigue.  Gastrointestinal: Positive for abdominal pain. Negative for blood in stool, constipation, nausea and vomiting.  Genitourinary: Negative for difficulty urinating and dysuria.  Musculoskeletal: Negative for back pain and myalgias.  Skin: Negative for color change.  Neurological: Negative for weakness.     Physical Exam Updated Vital Signs BP 126/76 (BP Location: Right Arm)   Pulse 64   Temp 98.3 F (36.8 C) (Oral)   Resp 18   SpO2 100%   Physical Exam  Constitutional: He is oriented to person, place, and time. He appears well-developed and well-nourished. No distress.  Neck: Normal range of motion.  Pulmonary/Chest:  Effort normal.  Abdominal: A hernia is present. Hernia confirmed positive in the left inguinal area.  Well healed linear surgical scar left lower abdomen. There is significant soft mass at this site c/w recurrent hernia. Hernia is easily reducible with minimal discomfort.   Genitourinary: Testes normal. Right testis shows no tenderness. Left testis shows no tenderness.  Genitourinary Comments: No scrotal swelling.  Musculoskeletal: Normal range of motion.  Neurological: He is alert and oriented  to person, place, and time.  Skin: Skin is warm and dry.  Psychiatric: He has a normal mood and affect.     ED Treatments / Results  Labs (all labs ordered are listed, but only abnormal results are displayed) Labs Reviewed  URINALYSIS, ROUTINE W REFLEX MICROSCOPIC (NOT AT [facility]) - Abnormal; Notable for the following:       Result Value   Protein, ur 30 (*)    Leukocytes, UA SMALL (*)    All other components within normal limits  URINE MICROSCOPIC-ADD ON - Abnormal; Notable for the following:    Squamous Epithelial / LPF 0-5 (*)    Bacteria, UA RARE (*)    All other components within normal limits    EKG  EKG Interpretation None       Radiology No results found.  Procedures Procedures (including critical care time)  Medications Ordered in ED Medications - No data to display   Initial Impression / Assessment and Plan / ED Course  I have reviewed the triage vital signs and the nursing notes.  Pertinent labs & imaging results that were available during my care of the patient were reviewed by me and considered in my medical decision making (see chart for details).  Clinical Course    Findings on exam consistent with uncomplicated, easily reducible, recurrent left inguinal hernia.   Final Clinical Impressions(s) / ED Diagnoses   Final diagnoses:  None  1. Left inguinal hernia  New Prescriptions New Prescriptions   No medications on file     Elpidio AnisShari Tad Fancher, Cordelia Poche-C 01/30/16 2143    Derwood KaplanAnkit Nanavati, MD 01/31/16 [phone]

## 2016-01-30 NOTE — ED Notes (Signed)
Pt d/c home with family.

## 2016-03-13 ENCOUNTER — Emergency Department (HOSPITAL_COMMUNITY)
Admission: EM | Admit: 2016-03-13 | Discharge: 2016-03-14 | Disposition: A | Discharge status: Home / Self Care | Payer: Medicare HMO | Attending: Emergency Medicine | Admitting: Emergency Medicine

## 2016-03-13 ENCOUNTER — Emergency Department (HOSPITAL_COMMUNITY): Payer: Medicare HMO

## 2016-03-13 ENCOUNTER — Encounter (HOSPITAL_COMMUNITY): Payer: Self-pay | Admitting: Emergency Medicine

## 2016-03-13 DIAGNOSIS — Z7982 Long term (current) use of aspirin: Secondary | ICD-10-CM | POA: Diagnosis not present

## 2016-03-13 DIAGNOSIS — F17210 Nicotine dependence, cigarettes, uncomplicated: Secondary | ICD-10-CM | POA: Diagnosis not present

## 2016-03-13 DIAGNOSIS — I10 Essential (primary) hypertension: Secondary | ICD-10-CM | POA: Diagnosis not present

## 2016-03-13 DIAGNOSIS — R55 Syncope and collapse: Secondary | ICD-10-CM | POA: Diagnosis present

## 2016-03-13 DIAGNOSIS — J449 Chronic obstructive pulmonary disease, unspecified: Secondary | ICD-10-CM | POA: Insufficient documentation

## 2016-03-13 LAB — URINALYSIS, ROUTINE W REFLEX MICROSCOPIC
BILIRUBIN URINE: NEGATIVE
GLUCOSE, UA: NEGATIVE mg/dL
Ketones, ur: NEGATIVE mg/dL
Nitrite: NEGATIVE
PH: 6 (ref 5.0–8.0)
Protein, ur: 30 mg/dL — AB
SPECIFIC GRAVITY, URINE: 1.021 (ref 1.005–1.030)

## 2016-03-13 LAB — PROTIME-INR
INR: 0.96
Prothrombin Time: 12.8 seconds (ref 11.4–15.2)

## 2016-03-13 LAB — CBC WITH DIFFERENTIAL/PLATELET
BASOS ABS: 0 10*3/uL (ref 0.0–0.1)
Basophils Relative: 0 %
Eosinophils Absolute: 0.1 10*3/uL (ref 0.0–0.7)
Eosinophils Relative: 1 %
HCT: 42.5 % (ref 39.0–52.0)
Hemoglobin: 13.9 g/dL (ref 13.0–17.0)
LYMPHS ABS: 1.1 10*3/uL (ref 0.7–4.0)
Lymphocytes Relative: 12 %
MCH: 30.3 pg (ref 26.0–34.0)
MCHC: 32.7 g/dL (ref 30.0–36.0)
MCV: 92.8 fL (ref 78.0–100.0)
MONO ABS: 0.5 10*3/uL (ref 0.1–1.0)
MONOS PCT: 5 %
Neutro Abs: 7.5 10*3/uL (ref 1.7–7.7)
Neutrophils Relative %: 82 %
PLATELETS: UNDETERMINED 10*3/uL (ref 150–400)
RBC: 4.58 MIL/uL (ref 4.22–5.81)
RDW: 14.5 % (ref 11.5–15.5)
WBC: 9.2 10*3/uL (ref 4.0–10.5)

## 2016-03-13 LAB — I-STAT CHEM 8, ED
BUN: 11 mg/dL (ref 6–20)
CALCIUM ION: 1.19 mmol/L (ref 1.15–1.40)
CREATININE: 1 mg/dL (ref 0.61–1.24)
Chloride: 103 mmol/L (ref 101–111)
GLUCOSE: 108 mg/dL — AB (ref 65–99)
HCT: 39 % (ref 39.0–52.0)
HEMOGLOBIN: 13.3 g/dL (ref 13.0–17.0)
POTASSIUM: 4.1 mmol/L (ref 3.5–5.1)
Sodium: 141 mmol/L (ref 135–145)
TCO2: 28 mmol/L (ref 0–100)

## 2016-03-13 LAB — URINE MICROSCOPIC-ADD ON

## 2016-03-13 LAB — I-STAT CG4 LACTIC ACID, ED: Lactic Acid, Venous: 0.73 mmol/L (ref 0.5–1.9)

## 2016-03-13 LAB — I-STAT TROPONIN, ED: TROPONIN I, POC: 0.01 ng/mL (ref 0.00–0.08)

## 2016-03-13 LAB — CBG MONITORING, ED: Glucose-Capillary: 130 mg/dL — ABNORMAL HIGH (ref 65–99)

## 2016-03-13 MED ORDER — SODIUM CHLORIDE 0.9 % IV BOLUS (SEPSIS)
1000.0000 mL | Freq: Once | INTRAVENOUS | Status: AC
  Administered 2016-03-13: 1000 mL via INTRAVENOUS

## 2016-03-13 MED ORDER — SODIUM CHLORIDE 0.9 % IV SOLN: INTRAVENOUS | Status: DC

## 2016-03-13 NOTE — ED Triage Notes (Signed)
Pt with [facility].  Received pneumonia shot today. Acute onset of dizziness around 1800. Diaphoretic. EKG NSR. VSS. Zofran 4mg  for nausea. Family at bedside.   126/60

## 2016-03-13 NOTE — ED Provider Notes (Signed)
[facility] Provider Note   CSN: [id number] Arrival date & time: 03/13/16  1850     History   Chief Complaint Chief Complaint  Patient presents with  . Near Syncope  . Nausea    HPI Clarence Padilla is a 82 y.o. male.  Pt said that he was at home today scratching off lottery tickets.  The pt said he suddenly became sweaty and felt nauseous.  He felt like he was going to pass out.  He denies cp or sob. The pt said that he feels better now.  He said that he did get the pneumonia vaccine this morning.  He did not eat lunch today, but that is normal for patient.      Past Medical History:  Diagnosis Date  . BPH (benign prostatic hyperplasia)   . COPD (chronic obstructive pulmonary disease) (HCC)   . Hyperlipidemia   . Hyperlipidemia 07/15/12  . Hypertension     Patient Active Problem List   Diagnosis Date Noted  . Hyperlipidemia     Past Surgical History:  Procedure Laterality Date  . CATARACT EXTRACTION W/PHACO Left 05/12/2013   Procedure: CATARACT EXTRACTION PHACO AND INTRAOCULAR LENS PLACEMENT (IOC);  Surgeon: Shade FloodGreer Geiger, MD;  [facility];  Service: Ophthalmology;  Laterality: Left;       Home Medications    Prior to Admission medications   Medication Sig Start Date End Date Taking? Authorizing Provider  aspirin 81 MG chewable tablet Chew 81 mg by mouth daily.   Yes Historical Provider, MD  atropine 1 % ophthalmic solution Place 1 drop into the left eye 2 (two) times daily.   Yes Historical Provider, MD  donepezil (ARICEPT ODT) 5 MG disintegrating tablet Take 5 mg by mouth daily. 04/27/15  Yes Historical Provider, MD  fluorometholone (FML) 0.1 % ophthalmic suspension Place 1 drop into the left eye 4 (four) times daily. 02/22/15  Yes Historical Provider, MD  lovastatin (MEVACOR) 40 MG tablet Take 40 mg by mouth at bedtime.   Yes Historical Provider, MD  prednisoLONE acetate (PRED FORTE) 1 % ophthalmic suspension Place 1 drop into the left eye 4 (four)  times daily. 05/16/15  Yes Historical Provider, MD  Tamsulosin HCl (FLOMAX) 0.4 MG CAPS Take 0.4 mg by mouth daily.   Yes Historical Provider, MD  naproxen (NAPROSYN) 375 MG tablet Take 1 tablet (375 mg total) by mouth 2 (two) times daily. Patient not taking: Reported on 03/13/2016 05/19/15   Arthor CaptainAbigail Harris, PA-C  traMADol (ULTRAM) 50 MG tablet Take 1 tablet (50 mg total) by mouth every 6 (six) hours as needed. Patient not taking: Reported on 03/13/2016 01/30/16   Elpidio AnisShari Upstill, PA-C    Family History No family history on file.  Social History Social History  Substance Use Topics  . Smoking status: Current Every Day Smoker    Packs/day: 1.00    Types: Cigarettes  . Smokeless tobacco: Never Used  . Alcohol use No     Allergies   Review of patient's allergies indicates no known allergies.   Review of Systems Review of Systems  Constitutional: Positive for diaphoresis.  Gastrointestinal: Positive for nausea.  Neurological: Positive for syncope.  All other systems reviewed and are negative.    Physical Exam Updated Vital Signs BP 131/68   Pulse 65   Temp 97.6 F (36.4 C) (Oral)   Resp 16   Ht 6\' 5"  (1.956 m)   Wt 155 lb (70.3 kg)   SpO2 98%   BMI 18.38  kg/m   Physical Exam  Constitutional: He is oriented to person, place, and time. He appears well-developed and well-nourished.  HENT:  Head: Normocephalic and atraumatic.  Right Ear: External ear normal.  Left Ear: External ear normal.  Nose: Nose normal.  Mouth/Throat: Oropharynx is clear and moist.  Eyes: Conjunctivae and EOM are normal. Pupils are equal, round, and reactive to light.  Neck: Normal range of motion. Neck supple.  Cardiovascular: Normal rate, regular rhythm, normal heart sounds and intact distal pulses.   Pulmonary/Chest: Effort normal and breath sounds normal.  Abdominal: Soft. Bowel sounds are normal.  Musculoskeletal: Normal range of motion.  Neurological: He is alert and oriented to person,  place, and time.  Skin: Skin is warm.  Psychiatric: He has a normal mood and affect. His behavior is normal. Judgment and thought content normal.  Nursing note and vitals reviewed.    ED Treatments / Results  Labs (all labs ordered are listed, but only abnormal results are displayed) Labs Reviewed  URINALYSIS, ROUTINE W REFLEX MICROSCOPIC (NOT AT [facility]) - Abnormal; Notable for the following:       Result Value   Hgb urine dipstick SMALL (*)    Protein, ur 30 (*)    Leukocytes, UA SMALL (*)    All other components within normal limits  URINE MICROSCOPIC-ADD ON - Abnormal; Notable for the following:    Squamous Epithelial / LPF 0-5 (*)    Bacteria, UA FEW (*)    Crystals CA OXALATE CRYSTALS (*)    All other components within normal limits  CBG MONITORING, ED - Abnormal; Notable for the following:    Glucose-Capillary 130 (*)    All other components within normal limits  I-STAT CHEM 8, ED - Abnormal; Notable for the following:    Glucose, Bld 108 (*)    All other components within normal limits  CBC WITH DIFFERENTIAL/PLATELET  PROTIME-INR  COMPREHENSIVE METABOLIC PANEL  TROPONIN I  POCT CBG (FASTING - GLUCOSE)-MANUAL ENTRY  I-STAT TROPOININ, ED  I-STAT CG4 LACTIC ACID, ED    EKG  EKG Interpretation  Date/Time:  Wednesday March 13 2016 21:05:02 EDT Ventricular Rate:  61 PR Interval:    QRS Duration: 91 QT Interval:  444 QTC Calculation: 448 R Axis:   85 Text Interpretation:  Sinus rhythm Borderline right axis deviation Confirmed by Eldrige Pitkin MD, Tyrisha Benninger ([id number]) on 03/13/2016 9:11:23 PM       Radiology Dg Chest 2 View  Result Date: 03/13/2016 CLINICAL DATA:  Weakness and dizziness EXAM: CHEST  2 VIEW COMPARISON:  10/12/2015 FINDINGS: Cardiac shadow is within normal limits. The lungs are well aerated bilaterally. Mild interstitial changes are seen without focal infiltrate or sizable effusion. No acute bony abnormality is noted. IMPRESSION: No acute abnormality seen.  Electronically Signed   By: Alcide Clever M.D.   On: 03/13/2016 21:24   Ct Head Wo Contrast  Result Date: 03/13/2016 CLINICAL DATA:  Dizziness following flu shot EXAM: CT HEAD WITHOUT CONTRAST TECHNIQUE: Contiguous axial images were obtained from the base of the skull through the vertex without intravenous contrast. COMPARISON:  09/06/2008 FINDINGS: Brain: Diffuse atrophic changes are noted similar to that noted on the prior exam. No findings to suggest acute hemorrhage, acute infarction or space-occupying mass lesion are noted. Vascular: No hyperdense vessel or unexpected calcification. Skull: Normal. Negative for fracture or focal lesion. Sinuses/Orbits: No acute finding. Other: None. IMPRESSION: Diffuse atrophic changes stable from the prior exam. No acute abnormality is noted. Electronically Signed   By:  Alcide Clever M.D.   On: 03/13/2016 21:27    Procedures Procedures (including critical care time)  Medications Ordered in ED Medications  sodium chloride 0.9 % bolus 1,000 mL (0 mLs Intravenous Stopped 03/13/16 2223)    And  0.9 %  sodium chloride infusion (not administered)     Initial Impression / Assessment and Plan / ED Course  I have reviewed the triage vital signs and the nursing notes.  Pertinent labs & imaging results that were available during my care of the patient were reviewed by me and considered in my medical decision making (see chart for details).  Clinical Course    Pt feels better.  He knows to return if worse and to eat small, frequent meals.  Final Clinical Impressions(s) / ED Diagnoses   Final diagnoses:  Near syncope    New Prescriptions New Prescriptions   No medications on file     Jacalyn Lefevre, MD 03/13/16 2335

## 2016-03-13 NOTE — ED Notes (Signed)
Patient transported to X-ray 

## 2016-03-13 NOTE — Discharge Instructions (Signed)
Eat small, frequent meals.   °

## 2016-04-30 ENCOUNTER — Ambulatory Visit (HOSPITAL_COMMUNITY)
Admission: EM | Admit: 2016-04-30 | Discharge: 2016-04-30 | Disposition: A | Discharge status: Home / Self Care | Payer: Medicare HMO | Attending: Family Medicine | Admitting: Family Medicine

## 2016-04-30 ENCOUNTER — Encounter (HOSPITAL_COMMUNITY): Payer: Self-pay | Admitting: Family Medicine

## 2016-04-30 DIAGNOSIS — S0181XA Laceration without foreign body of other part of head, initial encounter: Secondary | ICD-10-CM

## 2016-04-30 DIAGNOSIS — W19XXXA Unspecified fall, initial encounter: Secondary | ICD-10-CM | POA: Diagnosis not present

## 2016-04-30 NOTE — Discharge Instructions (Signed)
The glue that we used to seal the abrasions and your laceration will remain on your face for about for 5 days. You can remove it after this time by using Vaseline (petroleum jelly) .

## 2016-04-30 NOTE — ED Provider Notes (Signed)
[facility]    CSN: [id number] Arrival date & time: 04/30/16  1708     History   Chief Complaint No chief complaint on file.   HPI Clarence Padilla is a 82 y.o. male.   This is an 82 year old man who stepped in a hole and fell scraping his face and his right hand. He has no loss of consciousness and he denies any joint pain.      Past Medical History:  Diagnosis Date  . BPH (benign prostatic hyperplasia)   . COPD (chronic obstructive pulmonary disease) (HCC)   . Hyperlipidemia   . Hyperlipidemia 07/15/12  . Hypertension     Patient Active Problem List   Diagnosis Date Noted  . Hyperlipidemia     Past Surgical History:  Procedure Laterality Date  . CATARACT EXTRACTION W/PHACO Left 05/12/2013   Procedure: CATARACT EXTRACTION PHACO AND INTRAOCULAR LENS PLACEMENT (IOC);  Surgeon: Shade FloodGreer Geiger, MD;  [facility];  Service: Ophthalmology;  Laterality: Left;       Home Medications    Prior to Admission medications   Medication Sig Start Date End Date Taking? Authorizing Provider  aspirin 81 MG chewable tablet Chew 81 mg by mouth daily.    Historical Provider, MD  atropine 1 % ophthalmic solution Place 1 drop into the left eye 2 (two) times daily.    Historical Provider, MD  donepezil (ARICEPT ODT) 5 MG disintegrating tablet Take 5 mg by mouth daily. 04/27/15   Historical Provider, MD  fluorometholone (FML) 0.1 % ophthalmic suspension Place 1 drop into the left eye 4 (four) times daily. 02/22/15   Historical Provider, MD  lovastatin (MEVACOR) 40 MG tablet Take 40 mg by mouth at bedtime.    Historical Provider, MD  naproxen (NAPROSYN) 375 MG tablet Take 1 tablet (375 mg total) by mouth 2 (two) times daily. Patient not taking: Reported on 03/13/2016 05/19/15   Arthor CaptainAbigail Harris, PA-C  prednisoLONE acetate (PRED FORTE) 1 % ophthalmic suspension Place 1 drop into the left eye 4 (four) times daily. 05/16/15   Historical Provider, MD  Tamsulosin HCl (FLOMAX) 0.4 MG  CAPS Take 0.4 mg by mouth daily.    Historical Provider, MD  traMADol (ULTRAM) 50 MG tablet Take 1 tablet (50 mg total) by mouth every 6 (six) hours as needed. Patient not taking: Reported on 03/13/2016 01/30/16   Elpidio AnisShari Upstill, PA-C    Family History No family history on file.  Social History Social History  Substance Use Topics  . Smoking status: Current Every Day Smoker    Packs/day: 1.00    Types: Cigarettes  . Smokeless tobacco: Never Used  . Alcohol use No     Allergies   Patient has no known allergies.   Review of Systems Review of Systems  Constitutional: Negative.   Neurological: Negative.      Physical Exam Triage Vital Signs ED Triage Vitals [04/30/16 1727]  Enc Vitals Group     BP 141/63     Pulse Rate 80     Resp 16     Temp 98.7 F (37.1 C)     Temp Source Oral     SpO2 100 %     Weight      Height      Head Circumference      Peak Flow      Pain Score      Pain Loc      Pain Edu?      Excl. in GC?  No data found.   Updated Vital Signs BP 141/63 (BP Location: Left Arm)   Pulse 80   Temp 98.7 F (37.1 C) (Oral)   Resp 16   SpO2 100%    Physical Exam  Constitutional: He is oriented to person, place, and time. He appears well-developed and well-nourished.  HENT:  Right Ear: External ear normal.  Left Ear: External ear normal.  Mouth/Throat: Oropharynx is clear and moist.  Eyes: Conjunctivae are normal.  Neck: Normal range of motion. Neck supple.  Pulmonary/Chest: Effort normal.  Musculoskeletal: Normal range of motion.  Neurological: He is alert and oriented to person, place, and time.  Skin:  Multiple abrasions on [place]man and right hand, ulnar side. He has a 1.5 cm forehead laceration which was Dermabond.  Nursing note and vitals reviewed.    UC Treatments / Results  Labs (all labs ordered are listed, but only abnormal results are displayed) Labs Reviewed - No data to display  EKG  EKG Interpretation None        Radiology No results found.  Procedures .Marland Kitchen.Laceration Repair Date/Time: 04/30/2016 5:42 PM Performed by: Elvina SidleLAUENSTEIN, Isayah Ignasiak Authorized by: Elvina SidleLAUENSTEIN, Emmitt Matthews   Consent:    Consent obtained:  Verbal   Consent given by:  Patient   Risks discussed:  Poor cosmetic result   Alternatives discussed:  No treatment Anesthesia (see MAR for exact dosages):    Anesthesia method:  None Laceration details:    Location:  Face   Face location:  Forehead Exploration:    Wound extent: no nerve damage noted, no tendon damage noted, no underlying fracture noted and no vascular damage noted     Contaminated: no   Treatment:    Visualized foreign bodies/material removed: no   Skin repair:    Repair method:  Tissue adhesive Post-procedure details:    Dressing:  Open (no dressing)   Patient tolerance of procedure:  Tolerated well, no immediate complications   (including critical care time)  Medications Ordered in UC Medications - No data to display   Initial Impression / Assessment and Plan / UC Course  I have reviewed the triage vital signs and the nursing notes.  Pertinent labs & imaging results that were available during my care of the patient were reviewed by me and considered in my medical decision making (see chart for details).  Clinical Course     Final Clinical Impressions(s) / UC Diagnoses   Final diagnoses:  Facial laceration, initial encounter  Fall, initial encounter    New Prescriptions New Prescriptions   No medications on file     Elvina SidleKurt Latoya Diskin, MD 04/30/16 1745

## 2016-04-30 NOTE — ED Notes (Signed)
Patient triaged by Provider.  °

## 2016-05-20 ENCOUNTER — Ambulatory Visit (HOSPITAL_COMMUNITY)
Admission: EM | Admit: 2016-05-20 | Discharge: 2016-05-20 | Discharge status: Against Medical Advice | Payer: Medicare HMO | Attending: Family Medicine | Admitting: Family Medicine

## 2016-05-20 ENCOUNTER — Encounter (HOSPITAL_COMMUNITY): Payer: Self-pay | Admitting: Emergency Medicine

## 2016-05-20 NOTE — ED Triage Notes (Signed)
Patient seen 11/21 after stepping in a hole.  Patient reports his right hand continues to hurt.  There is an open wound to the edge of right hand, just behind little finger.  Wound is dry around edges, built up of dry skin.  No liquid from wound.  Reports this wound is still sore.

## 2016-05-20 NOTE — ED Provider Notes (Signed)
CSN: [id number]     Arrival date & time 05/20/16  1050 History   First MD Initiated Contact with Patient 05/20/16 1142     Chief Complaint  Patient presents with  . Hand Pain   (Consider location/radiation/quality/duration/timing/severity/associated sxs/prior Treatment) LWBS      Past Medical History:  Diagnosis Date  . BPH (benign prostatic hyperplasia)   . COPD (chronic obstructive pulmonary disease) (HCC)   . Hyperlipidemia   . Hyperlipidemia 07/15/12  . Hypertension    Past Surgical History:  Procedure Laterality Date  . CATARACT EXTRACTION W/PHACO Left 05/12/2013   Procedure: CATARACT EXTRACTION PHACO AND INTRAOCULAR LENS PLACEMENT (IOC);  Surgeon: Shade FloodGreer Geiger, MD;  [facility];  Service: Ophthalmology;  Laterality: Left;   No family history on file. Social History  Substance Use Topics  . Smoking status: Current Every Day Smoker    Packs/day: 1.00    Types: Cigarettes  . Smokeless tobacco: Never Used  . Alcohol use No    Review of Systems  Allergies  Patient has no known allergies.  Home Medications   Prior to Admission medications   Medication Sig Start Date End Date Taking? Authorizing Provider  aspirin 81 MG chewable tablet Chew 81 mg by mouth daily.    Historical Provider, MD  atropine 1 % ophthalmic solution Place 1 drop into the left eye 2 (two) times daily.    Historical Provider, MD  donepezil (ARICEPT ODT) 5 MG disintegrating tablet Take 5 mg by mouth daily. 04/27/15   Historical Provider, MD  fluorometholone (FML) 0.1 % ophthalmic suspension Place 1 drop into the left eye 4 (four) times daily. 02/22/15   Historical Provider, MD  lovastatin (MEVACOR) 40 MG tablet Take 40 mg by mouth at bedtime.    Historical Provider, MD  naproxen (NAPROSYN) 375 MG tablet Take 1 tablet (375 mg total) by mouth 2 (two) times daily. Patient not taking: Reported on 03/13/2016 05/19/15   Arthor CaptainAbigail Harris, PA-C  prednisoLONE acetate (PRED FORTE) 1 % ophthalmic suspension  Place 1 drop into the left eye 4 (four) times daily. 05/16/15   Historical Provider, MD  Tamsulosin HCl (FLOMAX) 0.4 MG CAPS Take 0.4 mg by mouth daily.    Historical Provider, MD  traMADol (ULTRAM) 50 MG tablet Take 1 tablet (50 mg total) by mouth every 6 (six) hours as needed. Patient not taking: Reported on 03/13/2016 01/30/16   Elpidio AnisShari Upstill, PA-C   Meds Ordered and Administered this Visit  Medications - No data to display  BP 142/78 (BP Location: Right Arm)   Temp 97.9 F (36.6 C) (Oral)   Resp 16   SpO2 96%  No data found.   Physical Exam  Urgent Care Course   Clinical Course     Procedures (including critical care time)  Labs Review Labs Reviewed - No data to display  Imaging Review No results found.   Visual Acuity Review  Right Eye Distance:   Left Eye Distance:   Bilateral Distance:    Right Eye Near:   Left Eye Near:    Bilateral Near:         MDM  No diagnosis found.     Hayden Rasmussenavid Chea Malan, NP 05/20/16 2102

## 2017-05-26 ENCOUNTER — Encounter (HOSPITAL_COMMUNITY): Payer: Self-pay | Admitting: Emergency Medicine

## 2017-05-26 ENCOUNTER — Emergency Department (HOSPITAL_COMMUNITY)
Admission: EM | Admit: 2017-05-26 | Discharge: 2017-05-26 | Disposition: A | Discharge status: Home / Self Care | Payer: Medicare HMO | Attending: Emergency Medicine | Admitting: Emergency Medicine

## 2017-05-26 ENCOUNTER — Emergency Department (HOSPITAL_COMMUNITY): Payer: Medicare HMO

## 2017-05-26 DIAGNOSIS — Z7982 Long term (current) use of aspirin: Secondary | ICD-10-CM | POA: Diagnosis not present

## 2017-05-26 DIAGNOSIS — Y9389 Activity, other specified: Secondary | ICD-10-CM | POA: Insufficient documentation

## 2017-05-26 DIAGNOSIS — Y92410 Unspecified street and highway as the place of occurrence of the external cause: Secondary | ICD-10-CM | POA: Insufficient documentation

## 2017-05-26 DIAGNOSIS — M25551 Pain in right hip: Secondary | ICD-10-CM | POA: Diagnosis not present

## 2017-05-26 DIAGNOSIS — Z79899 Other long term (current) drug therapy: Secondary | ICD-10-CM | POA: Diagnosis not present

## 2017-05-26 DIAGNOSIS — Z23 Encounter for immunization: Secondary | ICD-10-CM | POA: Insufficient documentation

## 2017-05-26 DIAGNOSIS — S80811A Abrasion, right lower leg, initial encounter: Secondary | ICD-10-CM | POA: Insufficient documentation

## 2017-05-26 DIAGNOSIS — J449 Chronic obstructive pulmonary disease, unspecified: Secondary | ICD-10-CM | POA: Diagnosis not present

## 2017-05-26 DIAGNOSIS — Y999 Unspecified external cause status: Secondary | ICD-10-CM | POA: Insufficient documentation

## 2017-05-26 DIAGNOSIS — F17210 Nicotine dependence, cigarettes, uncomplicated: Secondary | ICD-10-CM | POA: Insufficient documentation

## 2017-05-26 DIAGNOSIS — I10 Essential (primary) hypertension: Secondary | ICD-10-CM | POA: Diagnosis not present

## 2017-05-26 DIAGNOSIS — S8991XA Unspecified injury of right lower leg, initial encounter: Secondary | ICD-10-CM | POA: Diagnosis present

## 2017-05-26 MED ORDER — HYDROCODONE-ACETAMINOPHEN 5-325 MG PO TABS
1.0000 | ORAL_TABLET | Freq: Once | ORAL | Status: AC
  Administered 2017-05-26: 1 via ORAL
  Filled 2017-05-26: qty 1

## 2017-05-26 MED ORDER — TETANUS-DIPHTH-ACELL PERTUSSIS 5-2.5-18.5 LF-MCG/0.5 IM SUSP
0.5000 mL | Freq: Once | INTRAMUSCULAR | Status: AC
  Administered 2017-05-26: 0.5 mL via INTRAMUSCULAR
  Filled 2017-05-26: qty 0.5

## 2017-05-26 NOTE — Discharge Instructions (Signed)
Return to the ED with any concerns including worsening pain, chest or abdominal pain, not able to bear weight, or any other alarming symptoms
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
## 2017-05-26 NOTE — ED Notes (Signed)
Pt and family members were waiting to leave in the hallway.  States they need to leave and will f/u with his PCP tomorrow.  DC instruction given to pt's family member.

## 2017-05-26 NOTE — ED Notes (Signed)
Patient transported to X-ray 
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
## 2018-07-11 ENCOUNTER — Emergency Department (HOSPITAL_COMMUNITY)
Admission: EM | Admit: 2018-07-11 | Discharge: 2018-07-11 | Disposition: A | Discharge status: Home / Self Care | Payer: Medicare HMO | Attending: Emergency Medicine | Admitting: Emergency Medicine

## 2018-07-11 ENCOUNTER — Other Ambulatory Visit: Payer: Self-pay

## 2018-07-11 ENCOUNTER — Encounter (HOSPITAL_COMMUNITY): Payer: Self-pay | Admitting: Emergency Medicine

## 2018-07-11 DIAGNOSIS — F17210 Nicotine dependence, cigarettes, uncomplicated: Secondary | ICD-10-CM | POA: Diagnosis not present

## 2018-07-11 DIAGNOSIS — J449 Chronic obstructive pulmonary disease, unspecified: Secondary | ICD-10-CM | POA: Insufficient documentation

## 2018-07-11 DIAGNOSIS — Z5320 Procedure and treatment not carried out because of patient's decision for unspecified reasons: Secondary | ICD-10-CM | POA: Diagnosis not present

## 2018-07-11 DIAGNOSIS — Z79899 Other long term (current) drug therapy: Secondary | ICD-10-CM | POA: Diagnosis not present

## 2018-07-11 DIAGNOSIS — F0390 Unspecified dementia without behavioral disturbance: Secondary | ICD-10-CM | POA: Insufficient documentation

## 2018-07-11 DIAGNOSIS — T466X5A Adverse effect of antihyperlipidemic and antiarteriosclerotic drugs, initial encounter: Secondary | ICD-10-CM | POA: Diagnosis not present

## 2018-07-11 DIAGNOSIS — R42 Dizziness and giddiness: Secondary | ICD-10-CM | POA: Insufficient documentation

## 2018-07-11 DIAGNOSIS — I10 Essential (primary) hypertension: Secondary | ICD-10-CM | POA: Insufficient documentation

## 2018-07-11 HISTORY — DX: Unspecified dementia, unspecified severity, without behavioral disturbance, psychotic disturbance, mood disturbance, and anxiety: F03.90

## 2018-07-11 LAB — URINALYSIS, ROUTINE W REFLEX MICROSCOPIC
Bilirubin Urine: NEGATIVE
GLUCOSE, UA: NEGATIVE mg/dL
Ketones, ur: NEGATIVE mg/dL
LEUKOCYTES UA: NEGATIVE
Nitrite: NEGATIVE
PROTEIN: 100 mg/dL — AB
SPECIFIC GRAVITY, URINE: 1.02 (ref 1.005–1.030)
pH: 6 (ref 5.0–8.0)

## 2018-07-11 LAB — CBC WITH DIFFERENTIAL/PLATELET
ABS IMMATURE GRANULOCYTES: 0.04 10*3/uL (ref 0.00–0.07)
BASOS PCT: 0 %
Basophils Absolute: 0 10*3/uL (ref 0.0–0.1)
Eosinophils Absolute: 0.1 10*3/uL (ref 0.0–0.5)
Eosinophils Relative: 1 %
HEMATOCRIT: 42.7 % (ref 39.0–52.0)
HEMOGLOBIN: 14.1 g/dL (ref 13.0–17.0)
IMMATURE GRANULOCYTES: 1 %
Lymphocytes Relative: 14 %
Lymphs Abs: 1 10*3/uL (ref 0.7–4.0)
MCH: 30.3 pg (ref 26.0–34.0)
MCHC: 33 g/dL (ref 30.0–36.0)
MCV: 91.6 fL (ref 80.0–100.0)
MONO ABS: 0.3 10*3/uL (ref 0.1–1.0)
Monocytes Relative: 4 %
NEUTROS ABS: 6.1 10*3/uL (ref 1.7–7.7)
Neutrophils Relative %: 80 %
PLATELETS: 175 10*3/uL (ref 150–400)
RBC: 4.66 MIL/uL (ref 4.22–5.81)
RDW: 13.8 % (ref 11.5–15.5)
WBC: 7.5 10*3/uL (ref 4.0–10.5)
nRBC: 0 % (ref 0.0–0.2)

## 2018-07-11 LAB — BASIC METABOLIC PANEL
Anion gap: 12 (ref 5–15)
BUN: 13 mg/dL (ref 8–23)
CO2: 29 mmol/L (ref 22–32)
CREATININE: 1.25 mg/dL — AB (ref 0.61–1.24)
Calcium: 9.8 mg/dL (ref 8.9–10.3)
Chloride: 102 mmol/L (ref 98–111)
GFR calc Af Amer: >60 mL/min (ref >60)
GFR, EST NON AFRICAN AMERICAN: 53 mL/min — AB (ref >60)
GLUCOSE: 139 mg/dL — AB (ref 70–99)
POTASSIUM: 3.7 mmol/L (ref 3.5–5.1)
SODIUM: 143 mmol/L (ref 135–145)

## 2018-07-11 LAB — URINALYSIS, MICROSCOPIC (REFLEX)

## 2018-07-11 NOTE — Discharge Instructions (Signed)
Stop taking the medication you feel is responsible for your dizziness. You are leaving before your lab tests are resulted so no other reason for dizziness is can be assessed. Please make an appointment with Dr. August Saucer to review these labs and your symptoms. Return here as needed for any further concern.

## 2018-07-11 NOTE — ED Triage Notes (Signed)
Patient is from home, took some donepezil and laid down this evening, got up and had some dizziness with nausea.  Patient has stated that it has gone away, does have Dementia.  He has taken this before with this reaction but he doctor kept him on medication.  Patient does have some hypertension, no hypertension history per family.

## 2018-07-11 NOTE — ED Notes (Signed)
Pt seen walking from department with family, steady gait, NAD.

## 2018-07-11 NOTE — ED Provider Notes (Signed)
[facility] EMERGENCY DEPARTMENT Provider Note   CSN: [id number] Arrival date & time: 07/11/18  0041     History   Chief Complaint Chief Complaint  Patient presents with  . Dizziness    HPI Clarence Padilla is a 85 y.o. male.  Patient to ED for evaluation of dizziness that started earlier in the evening. He describes surroundings spinning and feeling off balance when he attempted to walk. No falls. No headache. Weakness localized to one side or the other, vomiting, visual changes. He started a medication today for cholesterol and states that the last time he took this medication it caused similar symptoms.  He states he currently has no symptoms and feels at baseline. Patient with a history of dementia, however, caregiver corroborates patient's history.  The history is provided by the patient, a relative and a caregiver. No language interpreter was used.    Past Medical History:  Diagnosis Date  . BPH (benign prostatic hyperplasia)   . COPD (chronic obstructive pulmonary disease) (HCC)   . Dementia (HCC)   . Hyperlipidemia   . Hyperlipidemia 07/15/12  . Hypertension     Patient Active Problem List   Diagnosis Date Noted  . Hyperlipidemia     Past Surgical History:  Procedure Laterality Date  . CATARACT EXTRACTION W/PHACO Left 05/12/2013   Procedure: CATARACT EXTRACTION PHACO AND INTRAOCULAR LENS PLACEMENT (IOC);  Surgeon: Shade Flood, MD;  Location: [facility] OR;  Service: Ophthalmology;  Laterality: Left;        Home Medications    Prior to Admission medications   Medication Sig Start Date End Date Taking? Authorizing Provider  aspirin 81 MG chewable tablet Chew 81 mg by mouth daily.    [provider]  atropine 1 % ophthalmic solution Place 1 drop into the left eye 2 (two) times daily.    [provider]  donepezil (ARICEPT ODT) 5 MG disintegrating tablet Take 5 mg by mouth daily. 04/27/15   [provider]  fluorometholone  (FML) 0.1 % ophthalmic suspension Place 1 drop into the left eye 4 (four) times daily. 02/22/15   [provider]  lovastatin (MEVACOR) 40 MG tablet Take 40 mg by mouth at bedtime.    [provider]  naproxen (NAPROSYN) 375 MG tablet Take 1 tablet (375 mg total) by mouth 2 (two) times daily. Patient not taking: Reported on 03/13/2016 05/19/15   Arthor Captain, PA-C  prednisoLONE acetate (PRED FORTE) 1 % ophthalmic suspension Place 1 drop into the left eye 4 (four) times daily. 05/16/15   [provider]  Tamsulosin HCl (FLOMAX) 0.4 MG CAPS Take 0.4 mg by mouth daily.    [provider]  traMADol (ULTRAM) 50 MG tablet Take 1 tablet (50 mg total) by mouth every 6 (six) hours as needed. Patient not taking: Reported on 03/13/2016 01/30/16   Elpidio Anis, PA-C    Family History No family history on file.  Social History Social History   Tobacco Use  . Smoking status: Current Every Day Smoker    Packs/day: 1.00    Types: Cigarettes  . Smokeless tobacco: Never Used  Substance Use Topics  . Alcohol use: No  . Drug use: Not on file     Allergies   Patient has no known allergies.   Review of Systems Review of Systems  Constitutional: Negative for chills and fever.  HENT: Negative.   Eyes: Negative for visual disturbance.  Respiratory: Negative.  Negative for shortness of breath.  Cardiovascular: Negative.  Negative for chest pain.  Gastrointestinal: Negative.  Negative for abdominal pain.  Musculoskeletal: Negative.   Skin: Negative.   Neurological: Positive for dizziness. Negative for syncope, facial asymmetry, speech difficulty, weakness, numbness and headaches.     Physical Exam Updated Vital Signs BP (!) 168/81 (BP Location: Right Arm)   Pulse 68   Temp (!) 97.5 F (36.4 C) (Oral)   Resp 18   SpO2 96%   Physical Exam Vitals signs and nursing note reviewed.  Constitutional:      General: He is not in acute distress.    Appearance:  Normal appearance. He is well-developed.  HENT:     Head: Normocephalic.     Nose: Nose normal.     Mouth/Throat:     Mouth: Mucous membranes are moist.  Eyes:     Conjunctiva/sclera: Conjunctivae normal.     Pupils: Pupils are equal, round, and reactive to light.  Neck:     Musculoskeletal: Normal range of motion and neck supple.  Cardiovascular:     Rate and Rhythm: Normal rate and regular rhythm.     Heart sounds: No murmur.  Pulmonary:     Effort: Pulmonary effort is normal.     Breath sounds: Normal breath sounds. No wheezing, rhonchi or rales.  Abdominal:     General: Bowel sounds are normal.     Palpations: Abdomen is soft.     Tenderness: There is no abdominal tenderness. There is no guarding or rebound.  Musculoskeletal: Normal range of motion.  Skin:    General: Skin is warm and dry.     Findings: No rash.  Neurological:     Mental Status: He is alert and oriented to person, place, and time.     Comments: CN's 3-12 grossly intact. Speech is clear and focused. No facial asymmetry. No lateralizing weakness.No deficits of coordination. Ambulatory without imbalance.        ED Treatments / Results  Labs (all labs ordered are listed, but only abnormal results are displayed) Labs Reviewed  BASIC METABOLIC PANEL  CBC WITH DIFFERENTIAL/PLATELET  URINALYSIS, ROUTINE W REFLEX MICROSCOPIC   Results for orders placed or performed during the hospital encounter of 07/11/18  Basic metabolic panel  Result Value Ref Range   Sodium 143 135 - 145 mmol/L   Potassium 3.7 3.5 - 5.1 mmol/L   Chloride 102 98 - 111 mmol/L   CO2 29 22 - 32 mmol/L   Glucose, Bld 139 (H) 70 - 99 mg/dL   BUN 13 8 - 23 mg/dL   Creatinine, Ser 0.16 (H) 0.61 - 1.24 mg/dL   Calcium 9.8 8.9 - 01.0 mg/dL   GFR calc non Af Amer 53 (L) >60 mL/min   GFR calc Af Amer >60 >60 mL/min   Anion gap 12 5 - 15  CBC with Differential  Result Value Ref Range   WBC 7.5 4.0 - 10.5 K/uL   RBC 4.66 4.22 - 5.81 MIL/uL     Hemoglobin 14.1 13.0 - 17.0 g/dL   HCT 93.2 35.5 - 73.2 %   MCV 91.6 80.0 - 100.0 fL   MCH 30.3 26.0 - 34.0 pg   MCHC 33.0 30.0 - 36.0 g/dL   RDW 20.2 54.2 - 70.6 %   Platelets 175 150 - 400 K/uL   nRBC 0.0 0.0 - 0.2 %   Neutrophils Relative % 80 %   Neutro Abs 6.1 1.7 - 7.7 K/uL   Lymphocytes Relative 14 %   Lymphs  Abs 1.0 0.7 - 4.0 K/uL   Monocytes Relative 4 %   Monocytes Absolute 0.3 0.1 - 1.0 K/uL   Eosinophils Relative 1 %   Eosinophils Absolute 0.1 0.0 - 0.5 K/uL   Basophils Relative 0 %   Basophils Absolute 0.0 0.0 - 0.1 K/uL   Immature Granulocytes 1 %   Abs Immature Granulocytes 0.04 0.00 - 0.07 K/uL  Urinalysis, Routine w reflex microscopic  Result Value Ref Range   Color, Urine YELLOW YELLOW   APPearance CLEAR CLEAR   Specific Gravity, Urine 1.020 1.005 - 1.030   pH 6.0 5.0 - 8.0   Glucose, UA NEGATIVE NEGATIVE mg/dL   Hgb urine dipstick LARGE (A) NEGATIVE   Bilirubin Urine NEGATIVE NEGATIVE   Ketones, ur NEGATIVE NEGATIVE mg/dL   Protein, ur 086100 (A) NEGATIVE mg/dL   Nitrite NEGATIVE NEGATIVE   Leukocytes, UA NEGATIVE NEGATIVE  Urinalysis, Microscopic (reflex)  Result Value Ref Range   RBC / HPF >50 0 - 5 RBC/hpf   WBC, UA 0-5 0 - 5 WBC/hpf   Bacteria, UA RARE (A) NONE SEEN   Squamous Epithelial / LPF 0-5 0 - 5    EKG EKG Interpretation  Date/Time:  Saturday July 11 2018 00:57:43 EST Ventricular Rate:  66 PR Interval:    QRS Duration: 106 QT Interval:  454 QTC Calculation: 476 R Axis:   82 Text Interpretation:  Sinus rhythm Borderline right axis deviation Left ventricular hypertrophy Nonspecific T abnormalities, lateral leads Baseline wander in lead(s) I III aVL Confirmed by Geoffery LyonseLo, Douglas ([id number]) on 07/11/2018 1:01:57 AM   Radiology No results found.  Procedures Procedures (including critical care time)  Medications Ordered in ED Medications - No data to display   Initial Impression / Assessment and Plan / ED Course  I have reviewed  the triage vital signs and the nursing notes.  Pertinent labs & imaging results that were available during my care of the patient were reviewed by me and considered in my medical decision making (see chart for details).     Patient to ED for evaluation of episode of dizziness after taking a new medication today. He reports similar symptoms when he took this medication in the past. He is currently asymptomatic.  Labs ordered to insure no other cause of dizziness including infection, electrolyte abnormality, stroke or TIA. However, patient insists on leaving prior to completion of evaluation.   He is observed to ambulate without difficulty. Family at bedside who wish to take him home.     Final Clinical Impressions(s) / ED Diagnoses   Final diagnoses:  None   1. Dizziness  ED Discharge Orders    None       Elpidio AnisUpstill, Cheyeanne Roadcap, PA-C 07/11/18 [id number]    Geoffery Lyonselo, [place], MD 07/11/18 [phone]

## 2018-07-22 ENCOUNTER — Ambulatory Visit
Admission: RE | Admit: 2018-07-22 | Discharge: 2018-07-22 | Disposition: A | Discharge status: Home / Self Care | Payer: Medicare HMO | Source: Ambulatory Visit | Attending: Internal Medicine | Admitting: Internal Medicine

## 2018-07-22 ENCOUNTER — Other Ambulatory Visit: Payer: Self-pay | Admitting: Internal Medicine

## 2018-07-22 DIAGNOSIS — R634 Abnormal weight loss: Secondary | ICD-10-CM

## 2018-08-09 ENCOUNTER — Encounter (HOSPITAL_COMMUNITY): Payer: Self-pay | Admitting: Emergency Medicine

## 2018-08-09 ENCOUNTER — Ambulatory Visit (HOSPITAL_COMMUNITY)
Admission: EM | Admit: 2018-08-09 | Discharge: 2018-08-09 | Disposition: A | Discharge status: Home / Self Care | Payer: Medicare HMO | Attending: Family Medicine | Admitting: Family Medicine

## 2018-08-09 DIAGNOSIS — L729 Follicular cyst of the skin and subcutaneous tissue, unspecified: Secondary | ICD-10-CM | POA: Diagnosis not present

## 2018-08-09 MED ORDER — TRIAMCINOLONE ACETONIDE 0.1 % EX CREA: 1.0000 "application " | TOPICAL_CREAM | Freq: Two times a day (BID) | CUTANEOUS | 0 refills | Status: DC

## 2018-08-09 NOTE — Discharge Instructions (Signed)
Cyst to the back is not infected today. As discussed, if drained without removing cyst wall, the cyst can grow back. You can apply triamcinolone to the itching area, may be an insect bite. Follow up with general surgery for removal of cyst. If it becomes infected, may need to drain the cyst without removing cyst wall, signs of infection includes pain, spreading redness, warm to touch, fever.

## 2018-08-09 NOTE — ED Triage Notes (Signed)
Pt states he has a cyst or an abscess on his back for "years". Pt denies pain. Pt states he wants to see if someone can "take it off".

## 2018-08-09 NOTE — ED Provider Notes (Signed)
[facility]    CSN: [id number] Arrival date & time: 08/09/18  1230     History   Chief Complaint Chief Complaint  Patient presents with  . Abscess    HPI Clarence Padilla is a 85 y.o. male.   85 year old male comes in with granddaughter for cyst to the back that has been there for "years". States would like cyst to be removed. Denies spreading erythema, warmth, pain, fever. For the past few days, does have some itching to the area. States the cyst has self drained in the past. Has not tried anything for it.      Past Medical History:  Diagnosis Date  . BPH (benign prostatic hyperplasia)   . COPD (chronic obstructive pulmonary disease) (HCC)   . Dementia (HCC)   . Hyperlipidemia   . Hyperlipidemia 07/15/12  . Hypertension     Patient Active Problem List   Diagnosis Date Noted  . Hyperlipidemia     Past Surgical History:  Procedure Laterality Date  . CATARACT EXTRACTION W/PHACO Left 05/12/2013   Procedure: CATARACT EXTRACTION PHACO AND INTRAOCULAR LENS PLACEMENT (IOC);  Surgeon: Shade Flood, MD;  Location: [facility] OR;  Service: Ophthalmology;  Laterality: Left;       Home Medications    Prior to Admission medications   Medication Sig Start Date End Date Taking? Authorizing Provider  aspirin 81 MG chewable tablet Chew 81 mg by mouth daily.    [provider]  donepezil (ARICEPT) 10 MG tablet Take 10 mg by mouth at bedtime.    [provider]  naproxen (NAPROSYN) 375 MG tablet Take 1 tablet (375 mg total) by mouth 2 (two) times daily. Patient not taking: Reported on 03/13/2016 05/19/15   Arthor Captain, PA-C  Tamsulosin HCl (FLOMAX) 0.4 MG CAPS Take 0.8 mg by mouth daily.     [provider]  traMADol (ULTRAM) 50 MG tablet Take 1 tablet (50 mg total) by mouth every 6 (six) hours as needed. Patient not taking: Reported on 03/13/2016 01/30/16   Elpidio Anis, PA-C  triamcinolone cream (KENALOG) 0.1 % Apply 1 application topically 2 (two)  times daily. 08/09/18   [name], PA-C    Family History No family history on file.  Social History Social History   Tobacco Use  . Smoking status: Current Every Day Smoker    Packs/day: 1.00    Types: Cigarettes  . Smokeless tobacco: Never Used  Substance Use Topics  . Alcohol use: No  . Drug use: Not on file     Allergies   Lovastatin   Review of Systems Review of Systems  Reason unable to perform ROS: See HPI as above.     Physical Exam Triage Vital Signs ED Triage Vitals [08/09/18 1304]  Enc Vitals Group     BP 137/81     Pulse Rate 91     Resp 16     Temp 97.6 F (36.4 C)     Temp Source Oral     SpO2      Weight      Height      Head Circumference      Peak Flow      Pain Score      Pain Loc      Pain Edu?      Excl. in GC?    No data found.  Updated Vital Signs BP 137/81 (BP Location: Right Arm)   Pulse 91   Temp 97.6 F (36.4  C) (Oral)   Resp 16   Physical Exam Constitutional:      General: He is not in acute distress.    Appearance: He is well-developed. He is not diaphoretic.  HENT:     Head: Normocephalic and atraumatic.  Eyes:     Conjunctiva/sclera: Conjunctivae normal.     Pupils: Pupils are equal, round, and reactive to light.  Skin:    Comments: 7cm x 5cm cyst to the right upper back. About 0.5cm x 0.5cm erythema to the top of the cyst, where patient has been itching. ?insect bite. Fluctuance felt to the upper cyst. No tenderness to palpation. No warmth.  Neurological:     Mental Status: He is alert and oriented to person, place, and time.      UC Treatments / Results  Labs (all labs ordered are listed, but only abnormal results are displayed) Labs Reviewed - No data to display  EKG None  Radiology No results found.  Procedures Procedures (including critical care time)  Medications Ordered in UC Medications - No data to display  Initial Impression / Assessment and Plan / UC Course  I have reviewed the triage  vital signs and the nursing notes.  Pertinent labs & imaging results that were available during my care of the patient were reviewed by me and considered in my medical decision making (see chart for details).    No signs on infected cyst today. Discussed with patient, if drained today, cyst can grow back. Will need to follow up with surgery/dermatology for cyst removal. Resources provided. Return precautions given. Patient expresses understanding and agrees to plan.  Final Clinical Impressions(s) / UC Diagnoses   Final diagnoses:  Cyst of skin    ED Prescriptions    Medication Sig Dispense Auth. Provider   triamcinolone cream (KENALOG) 0.1 % Apply 1 application topically 2 (two) times daily. 30 g Threasa Alpha, [place] 08/09/18 1439

## 2018-08-21 ENCOUNTER — Other Ambulatory Visit: Payer: Self-pay

## 2018-08-21 ENCOUNTER — Ambulatory Visit (HOSPITAL_COMMUNITY)
Admission: EM | Admit: 2018-08-21 | Discharge: 2018-08-21 | Discharge status: Against Medical Advice | Payer: Medicare HMO

## 2018-08-21 NOTE — ED Notes (Signed)
Pt called to Room 2 x1. No answer.

## 2018-08-21 NOTE — ED Notes (Signed)
Pt called to room 6.  No answer x2.  Pt called his daughter and had her come pick him up because he did not want to wait anymore.

## 2018-08-22 ENCOUNTER — Encounter (HOSPITAL_COMMUNITY): Payer: Self-pay

## 2018-08-22 ENCOUNTER — Other Ambulatory Visit: Payer: Self-pay

## 2018-08-22 ENCOUNTER — Ambulatory Visit (HOSPITAL_COMMUNITY)
Admission: EM | Admit: 2018-08-22 | Discharge: 2018-08-22 | Disposition: A | Discharge status: Home / Self Care | Payer: Medicare HMO | Attending: Family Medicine | Admitting: Family Medicine

## 2018-08-22 DIAGNOSIS — L0291 Cutaneous abscess, unspecified: Secondary | ICD-10-CM

## 2018-08-22 MED ORDER — LIDOCAINE-EPINEPHRINE (PF) 2 %-1:200000 IJ SOLN
INTRAMUSCULAR | Status: AC
  Filled 2018-08-22: qty 20

## 2018-08-22 MED ORDER — DOXYCYCLINE HYCLATE 100 MG PO TABS: 100.0000 mg | ORAL_TABLET | Freq: Two times a day (BID) | ORAL | 0 refills | Status: DC

## 2018-08-22 NOTE — ED Triage Notes (Signed)
Pt he has a abscess in the center of his back x 1 month or more.

## 2018-08-22 NOTE — Discharge Instructions (Addendum)
Wash in shower tomorrow  Recheck on Monday

## 2018-08-22 NOTE — ED Provider Notes (Signed)
[facility]    CSN: [id number] Arrival date & time: 08/22/18  1136     History   Chief Complaint Chief Complaint  Patient presents with  . Abscess    HPI Clarence Padilla is a 85 y.o. male.   Chronic 8 cm abscess on back whose primary care refused to treat.  Worsening and painful     Past Medical History:  Diagnosis Date  . BPH (benign prostatic hyperplasia)   . COPD (chronic obstructive pulmonary disease) (HCC)   . Dementia (HCC)   . Hyperlipidemia   . Hyperlipidemia 07/15/12  . Hypertension     Patient Active Problem List   Diagnosis Date Noted  . Hyperlipidemia     Past Surgical History:  Procedure Laterality Date  . CATARACT EXTRACTION W/PHACO Left 05/12/2013   Procedure: CATARACT EXTRACTION PHACO AND INTRAOCULAR LENS PLACEMENT (IOC);  Surgeon: Shade Flood, MD;  Location: [facility] OR;  Service: Ophthalmology;  Laterality: Left;       Home Medications    Prior to Admission medications   Medication Sig Start Date End Date Taking? Authorizing Provider  aspirin 81 MG chewable tablet Chew 81 mg by mouth daily.    [provider]  donepezil (ARICEPT) 10 MG tablet Take 10 mg by mouth at bedtime.    [provider]  doxycycline (VIBRA-TABS) 100 MG tablet Take 1 tablet (100 mg total) by mouth 2 (two) times daily. 08/22/18   Elvina Sidle, MD  Tamsulosin HCl (FLOMAX) 0.4 MG CAPS Take 0.8 mg by mouth daily.     [provider]  triamcinolone cream (KENALOG) 0.1 % Apply 1 application topically 2 (two) times daily. 08/09/18   [name], PA-C    Family History History reviewed. No pertinent family history.  Social History Social History   Tobacco Use  . Smoking status: Current Every Day Smoker    Packs/day: 1.00    Types: Cigarettes  . Smokeless tobacco: Never Used  Substance Use Topics  . Alcohol use: No  . Drug use: Not on file     Allergies   Lovastatin   Review of Systems Review of Systems   Physical Exam  Triage Vital Signs ED Triage Vitals  Enc Vitals Group     BP 08/22/18 1200 134/73     Pulse Rate 08/22/18 1200 82     Resp 08/22/18 1200 18     Temp 08/22/18 1200 98.5 F (36.9 C)     Temp Source 08/22/18 1200 Oral     SpO2 08/22/18 1200 100 %     Weight 08/22/18 1202 156 lb (70.8 kg)     Height --      Head Circumference --      Peak Flow --      Pain Score 08/22/18 1201 1     Pain Loc --      Pain Edu? --      Excl. in GC? --    No data found.  Updated Vital Signs BP (!) 144/52 (BP Location: Right Arm) Comment: Reported BP to Nurse Selena Batten Lapan Hutchens  Pulse 87   Temp 98 F (36.7 C) (Oral)   Resp 16   Wt 70.8 kg   SpO2 99%   BMI 18.50 kg/m    Physical Exam     UC Treatments / Results  Labs (all labs ordered are listed, but only abnormal results are displayed) Labs Reviewed - No data to display  EKG None  Radiology No results found.  Procedures Incision and Drainage Date/Time: 08/22/2018 1:37 PM Performed by: Elvina Sidle, MD Authorized by: Elvina Sidle, MD   Consent:    Consent obtained:  Verbal   Consent given by:  Patient   Risks discussed:  Infection   Alternatives discussed:  No treatment Location:    Type:  Abscess   Location:  Trunk   Trunk location:  Back Pre-procedure details:    Skin preparation:  Betadine Anesthesia (see MAR for exact dosages):    Anesthesia method:  Local infiltration   Local anesthetic:  Lidocaine 2% WITH epi Procedure type:    Complexity:  Complex Procedure details:    Needle aspiration: no     Incision types:  Stab incision   Incision depth:  Subcutaneous   Scalpel blade:  11   Wound management:  Probed and deloculated   Drainage:  Purulent   Drainage amount:  Copious   Wound treatment:  Wound left open and drain placed   Packing materials:  1/4 in gauze Post-procedure details:    Patient tolerance of procedure:  Tolerated well, no immediate complications   (including critical care time)   Medications Ordered in UC Medications - No data to display  Initial Impression / Assessment and Plan / UC Course  I have reviewed the triage vital signs and the nursing notes.  Pertinent labs & imaging results that were available during my care of the patient were reviewed by me and considered in my medical decision making (see chart for details).    Final Clinical Impressions(s) / UC Diagnoses   Final diagnoses:  Abscess     Discharge Instructions     Wash in shower tomorrow  Recheck on Monday    ED Prescriptions    Medication Sig Dispense Auth. Provider   doxycycline (VIBRA-TABS) 100 MG tablet Take 1 tablet (100 mg total) by mouth 2 (two) times daily. 20 tablet Elvina Sidle, MD     Controlled Substance Prescriptions [place] Controlled Substance Registry consulted? Not Applicable   Elvina Sidle, MD 08/22/18 1340

## 2018-08-26 ENCOUNTER — Other Ambulatory Visit: Payer: Self-pay

## 2018-08-26 ENCOUNTER — Ambulatory Visit (HOSPITAL_COMMUNITY)
Admission: EM | Admit: 2018-08-26 | Discharge: 2018-08-26 | Disposition: A | Discharge status: Home / Self Care | Payer: Medicare HMO | Attending: Family Medicine | Admitting: Family Medicine

## 2018-08-26 ENCOUNTER — Encounter (HOSPITAL_COMMUNITY): Payer: Self-pay | Admitting: Emergency Medicine

## 2018-08-26 DIAGNOSIS — Z09 Encounter for follow-up examination after completed treatment for conditions other than malignant neoplasm: Secondary | ICD-10-CM

## 2018-08-26 DIAGNOSIS — L02212 Cutaneous abscess of back [any part, except buttock]: Secondary | ICD-10-CM

## 2018-08-26 MED ORDER — MUPIROCIN 2 % EX OINT: 1.0000 "application " | TOPICAL_OINTMENT | Freq: Two times a day (BID) | CUTANEOUS | 0 refills | Status: DC

## 2018-08-26 NOTE — ED Triage Notes (Signed)
Patient seen 3/14.  Patient here today for follow up evaluation on abscess located on back.  Patient says he is feeling good.  Caregiver reports site is draining

## 2018-08-26 NOTE — ED Provider Notes (Signed)
[facility]    CSN: [id number] Arrival date & time: 08/26/18  1332     History   Chief Complaint Chief Complaint  Patient presents with  . Follow-up    HPI Clarence Padilla is a 85 y.o. male.   85 year old male comes in for recheck after I&D on 3/14. He has had much improvement of symptoms since drainage. Has had daily dressings with some drainage to the site. No erythema, warmth, pain. Denies fever, chills, night sweats. Has been taking doxycycline as directed without problems.      Past Medical History:  Diagnosis Date  . BPH (benign prostatic hyperplasia)   . COPD (chronic obstructive pulmonary disease) (HCC)   . Dementia (HCC)   . Hyperlipidemia   . Hyperlipidemia 07/15/12  . Hypertension     Patient Active Problem List   Diagnosis Date Noted  . Hyperlipidemia     Past Surgical History:  Procedure Laterality Date  . CATARACT EXTRACTION W/PHACO Left 05/12/2013   Procedure: CATARACT EXTRACTION PHACO AND INTRAOCULAR LENS PLACEMENT (IOC);  Surgeon: Shade Flood, MD;  Location: [facility] OR;  Service: Ophthalmology;  Laterality: Left;       Home Medications    Prior to Admission medications   Medication Sig Start Date End Date Taking? Authorizing Provider  aspirin 81 MG chewable tablet Chew 81 mg by mouth daily.    [provider]  donepezil (ARICEPT) 10 MG tablet Take 10 mg by mouth at bedtime.    [provider]  doxycycline (VIBRA-TABS) 100 MG tablet Take 1 tablet (100 mg total) by mouth 2 (two) times daily. 08/22/18   Elvina Sidle, MD  mupirocin ointment (BACTROBAN) 2 % Apply 1 application topically 2 (two) times daily. 08/26/18   Cathie Hoops, Maralee Higuchi V, PA-C  Tamsulosin HCl (FLOMAX) 0.4 MG CAPS Take 0.8 mg by mouth daily.     [provider]    Family History No family history on file.  Social History Social History   Tobacco Use  . Smoking status: Current Every Day Smoker    Packs/day: 1.00    Types: Cigarettes  . Smokeless  tobacco: Never Used  Substance Use Topics  . Alcohol use: No  . Drug use: Not on file     Allergies   Lovastatin   Review of Systems Review of Systems  Reason unable to perform ROS: See HPI as above.     Physical Exam Triage Vital Signs ED Triage Vitals  Enc Vitals Group     BP 08/26/18 1344 (!) 118/58     Pulse Rate 08/26/18 1344 67     Resp 08/26/18 1344 18     Temp 08/26/18 1344 (!) 97.5 F (36.4 C)     Temp Source 08/26/18 1344 Oral     SpO2 08/26/18 1344 100 %     Weight --      Height --      Head Circumference --      Peak Flow --      Pain Score 08/26/18 1343 0     Pain Loc --      Pain Edu? --      Excl. in GC? --    No data found.  Updated Vital Signs BP (!) 118/58 (BP Location: Right Arm)   Pulse 67   Temp (!) 97.5 F (36.4 C) (Oral)   Resp 18   SpO2 100%   Physical Exam Constitutional:      General: He is not in  acute distress.    Appearance: He is well-developed. He is not diaphoretic.  HENT:     Head: Normocephalic and atraumatic.  Eyes:     Conjunctiva/sclera: Conjunctivae normal.     Pupils: Pupils are equal, round, and reactive to light.  Skin:    Comments: 1cm incision site noted with another opening adjacent from abscess self draining. Packing was removed when gauze removed by patient's caregiver. No active drainage noted. Surrounding hyperpigmentation that has been present prior to I&D. No erythema, warmth. Mild tenderness to palpation of surrounding area. No fluctuance felt.   Neurological:     Mental Status: He is alert and oriented to person, place, and time.      UC Treatments / Results  Labs (all labs ordered are listed, but only abnormal results are displayed) Labs Reviewed - No data to display  EKG None  Radiology No results found.  Procedures Procedures (including critical care time)  Medications Ordered in UC Medications - No data to display  Initial Impression / Assessment and Plan / UC Course  I have  reviewed the triage vital signs and the nursing notes.  Pertinent labs & imaging results that were available during my care of the patient were reviewed by me and considered in my medical decision making (see chart for details).    No alarming signs on exam. No cellulitis noted. Will have patient continue doxycycline. Continue wound care instructions. Return precautions given. Patient and caregiver expresses understanding and agrees to plan.  Final Clinical Impressions(s) / UC Diagnoses   Final diagnoses:  Encounter for recheck of abscess following incision and drainage    ED Prescriptions    Medication Sig Dispense Auth. Provider   mupirocin ointment (BACTROBAN) 2 % Apply 1 application topically 2 (two) times daily. 22 g Threasa Alpha, [place] 08/26/18 1433

## 2018-08-26 NOTE — Discharge Instructions (Signed)
Packing removed. Continue doxycycline as directed. Keep wound clean and dry. You can wash it with soap and water. Daily dressings. As discussed, area will slowly heal on its own from inside out. Monitor for spreading redness increased warmth, fever, follow up for reevaluation needed.

## 2019-01-29 ENCOUNTER — Other Ambulatory Visit: Payer: Self-pay | Admitting: Internal Medicine

## 2019-02-09 ENCOUNTER — Other Ambulatory Visit: Payer: Self-pay | Admitting: Internal Medicine

## 2019-02-09 DIAGNOSIS — J42 Unspecified chronic bronchitis: Secondary | ICD-10-CM

## 2019-02-09 DIAGNOSIS — Z720 Tobacco use: Secondary | ICD-10-CM

## 2019-04-06 ENCOUNTER — Ambulatory Visit: Payer: Medicare HMO | Admitting: Podiatry

## 2019-04-06 ENCOUNTER — Telehealth: Payer: Self-pay | Admitting: *Deleted

## 2019-04-06 ENCOUNTER — Other Ambulatory Visit: Payer: Self-pay

## 2019-04-06 DIAGNOSIS — M79676 Pain in unspecified toe(s): Secondary | ICD-10-CM

## 2019-04-06 DIAGNOSIS — Q828 Other specified congenital malformations of skin: Secondary | ICD-10-CM

## 2019-04-06 DIAGNOSIS — I739 Peripheral vascular disease, unspecified: Secondary | ICD-10-CM | POA: Diagnosis not present

## 2019-04-06 DIAGNOSIS — B351 Tinea unguium: Secondary | ICD-10-CM | POA: Diagnosis not present

## 2019-04-06 NOTE — Telephone Encounter (Signed)
Prepared orders and referral to be faxed with required form, demographics and clinicals to [facility]

## 2019-04-06 NOTE — Telephone Encounter (Signed)
-----   Message from Rip Harbour, [facility] sent at 04/06/2019 10:34 AM EDT ----- Regarding: Vascular Vascular studies - arterial doppler/ABI's both extremities

## 2019-04-07 NOTE — Progress Notes (Signed)
Subjective:  Patient ID: Clarence Padilla, male    DOB: 10/08/1933,  MRN: [id number] HPI Chief Complaint  Patient presents with  . Nail Problem    Left 1st nail pain 2 month duration. Pt states it has grown this way since he was a child after the nail was injured.  . Nail Problem    Nail trim 1-5 bilateral  . Callouses    Bilateral plantar callous trim    85 y.o. male presents with the above complaint.   ROS: Denies fever chills nausea vomiting muscle aches pains calf pain back pain chest pain shortness of breath.  Past Medical History:  Diagnosis Date  . BPH (benign prostatic hyperplasia)   . COPD (chronic obstructive pulmonary disease) (HCC)   . Dementia (HCC)   . Hyperlipidemia   . Hyperlipidemia 07/15/12  . Hypertension    Past Surgical History:  Procedure Laterality Date  . CATARACT EXTRACTION W/PHACO Left 05/12/2013   Procedure: CATARACT EXTRACTION PHACO AND INTRAOCULAR LENS PLACEMENT (IOC);  Surgeon: Shade Flood, MD;  Location: [facility] OR;  Service: Ophthalmology;  Laterality: Left;    Current Outpatient Medications:  .  aspirin 81 MG chewable tablet, Chew 81 mg by mouth daily., Disp: , Rfl:  .  donepezil (ARICEPT) 10 MG tablet, Take 10 mg by mouth at bedtime., Disp: , Rfl:  .  memantine (NAMENDA) 10 MG tablet, Take 10 mg by mouth daily., Disp: , Rfl:  .  rosuvastatin (CRESTOR) 5 MG tablet, Take 5 mg by mouth daily., Disp: , Rfl:  .  Tamsulosin HCl (FLOMAX) 0.4 MG CAPS, Take 0.8 mg by mouth daily. , Disp: , Rfl:  .  doxycycline (VIBRA-TABS) 100 MG tablet, Take 1 tablet (100 mg total) by mouth 2 (two) times daily. (Patient not taking: Reported on 04/06/2019), Disp: 20 tablet, Rfl: 0 .  mupirocin ointment (BACTROBAN) 2 %, Apply 1 application topically 2 (two) times daily. (Patient not taking: Reported on 04/06/2019), Disp: 22 g, Rfl: 0  Allergies  Allergen Reactions  . Lovastatin Nausea And Vomiting   Review of Systems Objective:  There were no vitals filed for this  visit.  General: Well developed, nourished, in no acute distress, alert and oriented x3   Dermatological: Skin is warm, dry and supple bilateral. Nails x 10 are thick yellow dystrophic painful and severely mycotic and elongated.  Remaining integument appears unremarkable at this time. There are no open sores, no preulcerative lesions, no rash or signs of infection present. Thick plantar callouses sub first and fifth metatatarsal heads bilaterally.  Vascular: Dorsalis Pedis artery and Posterior Tibial artery pedal pulses are 0/4 bilateral with delayed capillary fill time. Pedal hair growth present. No varicosities and no lower extremity edema present bilateral.   Neruologic: Grossly intact via light touch bilateral. Vibratory intact via tuning fork bilateral. Protective threshold with Semmes Wienstein monofilament intact to all pedal sites bilateral. Patellar and Achilles deep tendon reflexes 2+ bilateral. No Babinski or clonus noted bilateral.   Musculoskeletal: No gross boney pedal deformities bilateral. No pain, crepitus, or limitation noted with foot and ankle range of motion bilateral. Muscular strength 5/5 in all groups tested bilateral.  Gait: Unassisted, Nonantalgic.    Radiographs:  None taken  Assessment & Plan:   Assessment: Peripheral vascular disease and severe painful elongated mycotic nails.  Severe poor keratomas plantar aspect of the bilateral foot.  Plan: Debridement of all reactive hyperkeratotic tissue.  Debridement of toenails 1 through 5 bilateral.  Requesting ABIs and will follow-up with him  in the near future for discussion if necessary.  Otherwise he will follow-up with Dr. Elisha Ponder for routine debridement.     Max T. Hastings, [place]

## 2019-04-12 ENCOUNTER — Telehealth (HOSPITAL_COMMUNITY): Payer: Self-pay

## 2019-04-12 NOTE — Telephone Encounter (Signed)

## 2019-04-13 ENCOUNTER — Encounter (HOSPITAL_COMMUNITY): Payer: Medicare HMO

## 2019-04-16 ENCOUNTER — Ambulatory Visit (INDEPENDENT_AMBULATORY_CARE_PROVIDER_SITE_OTHER)
Admission: RE | Admit: 2019-04-16 | Discharge: 2019-04-16 | Disposition: A | Discharge status: Home / Self Care | Payer: Medicare HMO | Source: Ambulatory Visit | Attending: Podiatry | Admitting: Podiatry

## 2019-04-16 ENCOUNTER — Other Ambulatory Visit: Payer: Self-pay

## 2019-04-16 ENCOUNTER — Ambulatory Visit (HOSPITAL_COMMUNITY)
Admission: RE | Admit: 2019-04-16 | Discharge: 2019-04-16 | Disposition: A | Discharge status: Home / Self Care | Payer: Medicare HMO | Source: Ambulatory Visit | Attending: Podiatry | Admitting: Podiatry

## 2019-04-16 DIAGNOSIS — I739 Peripheral vascular disease, unspecified: Secondary | ICD-10-CM | POA: Diagnosis not present

## 2019-04-19 NOTE — Telephone Encounter (Deleted)
Referral appt to [facility] 04/16/2019.

## 2019-04-19 NOTE — Telephone Encounter (Signed)
-----   Message from Garrel Ridgel, [place] sent at 04/17/2019  8:54 AM EST ----- Make sure that he has a vascular consult set up for possible tx.

## 2019-04-23 ENCOUNTER — Other Ambulatory Visit: Payer: Self-pay | Admitting: Internal Medicine

## 2019-04-23 DIAGNOSIS — Z720 Tobacco use: Secondary | ICD-10-CM

## 2019-04-23 DIAGNOSIS — R634 Abnormal weight loss: Secondary | ICD-10-CM

## 2019-04-27 ENCOUNTER — Other Ambulatory Visit: Payer: Self-pay | Admitting: Internal Medicine

## 2019-04-28 ENCOUNTER — Ambulatory Visit
Admission: RE | Admit: 2019-04-28 | Discharge: 2019-04-28 | Disposition: A | Discharge status: Home / Self Care | Payer: Medicare HMO | Source: Ambulatory Visit | Attending: Internal Medicine | Admitting: Internal Medicine

## 2019-04-28 DIAGNOSIS — R634 Abnormal weight loss: Secondary | ICD-10-CM

## 2019-04-28 DIAGNOSIS — Z720 Tobacco use: Secondary | ICD-10-CM

## 2019-07-02 ENCOUNTER — Telehealth: Payer: Self-pay | Admitting: Internal Medicine

## 2019-07-02 NOTE — Telephone Encounter (Signed)
Scheduled appt with [facility] for 07-09-19 at 10:00.

## 2019-07-09 ENCOUNTER — Other Ambulatory Visit: Payer: Self-pay

## 2019-07-09 ENCOUNTER — Other Ambulatory Visit: Payer: Medicare HMO | Admitting: Internal Medicine

## 2019-11-04 ENCOUNTER — Other Ambulatory Visit: Payer: Self-pay | Admitting: Internal Medicine

## 2019-11-04 DIAGNOSIS — R918 Other nonspecific abnormal finding of lung field: Secondary | ICD-10-CM

## 2019-11-19 ENCOUNTER — Other Ambulatory Visit: Payer: Self-pay | Admitting: Internal Medicine

## 2019-11-22 ENCOUNTER — Ambulatory Visit
Admission: RE | Admit: 2019-11-22 | Discharge: 2019-11-22 | Disposition: A | Discharge status: Home / Self Care | Payer: Medicare HMO | Source: Ambulatory Visit | Attending: Internal Medicine | Admitting: Internal Medicine

## 2019-11-22 ENCOUNTER — Other Ambulatory Visit: Payer: Medicare HMO

## 2019-11-22 DIAGNOSIS — R918 Other nonspecific abnormal finding of lung field: Secondary | ICD-10-CM

## 2020-03-07 ENCOUNTER — Other Ambulatory Visit: Payer: Self-pay

## 2020-03-07 ENCOUNTER — Encounter: Payer: Self-pay | Admitting: Podiatry

## 2020-03-07 ENCOUNTER — Ambulatory Visit: Payer: Medicare HMO | Admitting: Podiatry

## 2020-03-07 DIAGNOSIS — B351 Tinea unguium: Secondary | ICD-10-CM | POA: Diagnosis not present

## 2020-03-07 DIAGNOSIS — M79676 Pain in unspecified toe(s): Secondary | ICD-10-CM

## 2020-03-07 DIAGNOSIS — Q828 Other specified congenital malformations of skin: Secondary | ICD-10-CM

## 2020-03-08 NOTE — Progress Notes (Signed)
He presents today chief complaint of painful calluses and painfully elongated toenails.  Objective: Vital signs are stable he is alert oriented x3 pulses remain palpable bilateral.  No open lesions or wounds.  Large porokeratotic lesion subfirst and subfifth bilaterally.  These are exquisitely tender on palpation as well as debridement was debrided do not demonstrate any iatrogenic lesions.  Toenails are long thick yellow dystrophic-like mycotic sharply incurvated and rolled under.  Assessment: Pain in limb secondary to onychomycosis nail dystrophy and neglect as well as painful porokeratotic lesions plantar aspect of the forefoot bilaterally.  Plan: Debrided reactive hyperkeratotic tissue placed padding and also debrided his nails for him.

## 2020-05-11 ENCOUNTER — Other Ambulatory Visit: Payer: Self-pay | Admitting: Internal Medicine

## 2020-05-11 DIAGNOSIS — R918 Other nonspecific abnormal finding of lung field: Secondary | ICD-10-CM

## 2020-05-18 ENCOUNTER — Encounter (HOSPITAL_BASED_OUTPATIENT_CLINIC_OR_DEPARTMENT_OTHER): Payer: Self-pay | Admitting: *Deleted

## 2020-05-18 ENCOUNTER — Inpatient Hospital Stay (HOSPITAL_BASED_OUTPATIENT_CLINIC_OR_DEPARTMENT_OTHER)
Admission: EM | Admit: 2020-05-18 | Discharge: 2020-05-22 | DRG: 682 | Disposition: A | Discharge status: Hospice | Payer: Medicare HMO | Attending: Internal Medicine | Admitting: Internal Medicine

## 2020-05-18 ENCOUNTER — Other Ambulatory Visit: Payer: Self-pay

## 2020-05-18 ENCOUNTER — Emergency Department (HOSPITAL_BASED_OUTPATIENT_CLINIC_OR_DEPARTMENT_OTHER): Payer: Medicare HMO

## 2020-05-18 DIAGNOSIS — F17210 Nicotine dependence, cigarettes, uncomplicated: Secondary | ICD-10-CM | POA: Diagnosis present

## 2020-05-18 DIAGNOSIS — R338 Other retention of urine: Secondary | ICD-10-CM | POA: Diagnosis present

## 2020-05-18 DIAGNOSIS — Z515 Encounter for palliative care: Secondary | ICD-10-CM

## 2020-05-18 DIAGNOSIS — E785 Hyperlipidemia, unspecified: Secondary | ICD-10-CM | POA: Diagnosis present

## 2020-05-18 DIAGNOSIS — J449 Chronic obstructive pulmonary disease, unspecified: Secondary | ICD-10-CM | POA: Diagnosis present

## 2020-05-18 DIAGNOSIS — R531 Weakness: Secondary | ICD-10-CM | POA: Diagnosis not present

## 2020-05-18 DIAGNOSIS — E872 Acidosis: Secondary | ICD-10-CM | POA: Diagnosis not present

## 2020-05-18 DIAGNOSIS — G9341 Metabolic encephalopathy: Secondary | ICD-10-CM | POA: Diagnosis present

## 2020-05-18 DIAGNOSIS — Z7982 Long term (current) use of aspirin: Secondary | ICD-10-CM | POA: Diagnosis not present

## 2020-05-18 DIAGNOSIS — R64 Cachexia: Secondary | ICD-10-CM | POA: Diagnosis present

## 2020-05-18 DIAGNOSIS — N179 Acute kidney failure, unspecified: Principal | ICD-10-CM | POA: Diagnosis present

## 2020-05-18 DIAGNOSIS — Z888 Allergy status to other drugs, medicaments and biological substances status: Secondary | ICD-10-CM | POA: Diagnosis not present

## 2020-05-18 DIAGNOSIS — I10 Essential (primary) hypertension: Secondary | ICD-10-CM | POA: Diagnosis present

## 2020-05-18 DIAGNOSIS — R627 Adult failure to thrive: Secondary | ICD-10-CM | POA: Diagnosis present

## 2020-05-18 DIAGNOSIS — R1310 Dysphagia, unspecified: Secondary | ICD-10-CM | POA: Diagnosis present

## 2020-05-18 DIAGNOSIS — N136 Pyonephrosis: Secondary | ICD-10-CM | POA: Diagnosis present

## 2020-05-18 DIAGNOSIS — Z66 Do not resuscitate: Secondary | ICD-10-CM | POA: Diagnosis not present

## 2020-05-18 DIAGNOSIS — F0390 Unspecified dementia without behavioral disturbance: Secondary | ICD-10-CM | POA: Diagnosis present

## 2020-05-18 DIAGNOSIS — R310 Gross hematuria: Secondary | ICD-10-CM | POA: Diagnosis not present

## 2020-05-18 DIAGNOSIS — N401 Enlarged prostate with lower urinary tract symptoms: Secondary | ICD-10-CM | POA: Diagnosis present

## 2020-05-18 DIAGNOSIS — Z681 Body mass index (BMI) 19 or less, adult: Secondary | ICD-10-CM

## 2020-05-18 DIAGNOSIS — E870 Hyperosmolality and hypernatremia: Secondary | ICD-10-CM | POA: Diagnosis present

## 2020-05-18 DIAGNOSIS — Z20822 Contact with and (suspected) exposure to covid-19: Secondary | ICD-10-CM | POA: Diagnosis present

## 2020-05-18 DIAGNOSIS — Z79899 Other long term (current) drug therapy: Secondary | ICD-10-CM

## 2020-05-18 LAB — COMPREHENSIVE METABOLIC PANEL
ALT: 28 U/L (ref 0–44)
AST: 22 U/L (ref 15–41)
Albumin: 4 g/dL (ref 3.5–5.0)
Alkaline Phosphatase: 65 U/L (ref 38–126)
Anion gap: 20 — ABNORMAL HIGH (ref 5–15)
BUN: 192 mg/dL — ABNORMAL HIGH (ref 8–23)
CO2: 17 mmol/L — ABNORMAL LOW (ref 22–32)
Calcium: 9 mg/dL (ref 8.9–10.3)
Chloride: 120 mmol/L — ABNORMAL HIGH (ref 98–111)
Creatinine, Ser: 12.32 mg/dL — ABNORMAL HIGH (ref 0.61–1.24)
GFR, Estimated: 4 mL/min — ABNORMAL LOW (ref >60)
Glucose, Bld: 121 mg/dL — ABNORMAL HIGH (ref 70–99)
Potassium: 4.1 mmol/L (ref 3.5–5.1)
Sodium: 157 mmol/L — ABNORMAL HIGH (ref 135–145)
Total Bilirubin: 0.8 mg/dL (ref 0.3–1.2)
Total Protein: 7.3 g/dL (ref 6.5–8.1)

## 2020-05-18 LAB — CBC WITH DIFFERENTIAL/PLATELET
Abs Immature Granulocytes: 0.03 10*3/uL (ref 0.00–0.07)
Basophils Absolute: 0 10*3/uL (ref 0.0–0.1)
Basophils Relative: 0 %
Eosinophils Absolute: 0.2 10*3/uL (ref 0.0–0.5)
Eosinophils Relative: 3 %
HCT: 45 % (ref 39.0–52.0)
Hemoglobin: 14.7 g/dL (ref 13.0–17.0)
Immature Granulocytes: 1 %
Lymphocytes Relative: 18 %
Lymphs Abs: 1.1 10*3/uL (ref 0.7–4.0)
MCH: 30.4 pg (ref 26.0–34.0)
MCHC: 32.7 g/dL (ref 30.0–36.0)
MCV: 93.2 fL (ref 80.0–100.0)
Monocytes Absolute: 0.3 10*3/uL (ref 0.1–1.0)
Monocytes Relative: 5 %
Neutro Abs: 4.7 10*3/uL (ref 1.7–7.7)
Neutrophils Relative %: 73 %
Platelets: 177 10*3/uL (ref 150–400)
RBC: 4.83 MIL/uL (ref 4.22–5.81)
RDW: 14.6 % (ref 11.5–15.5)
WBC: 6.4 10*3/uL (ref 4.0–10.5)
nRBC: 0 % (ref 0.0–0.2)

## 2020-05-18 LAB — URINALYSIS, ROUTINE W REFLEX MICROSCOPIC
Bilirubin Urine: NEGATIVE
Glucose, UA: NEGATIVE mg/dL
Ketones, ur: NEGATIVE mg/dL
Nitrite: NEGATIVE
Protein, ur: 30 mg/dL — AB
Specific Gravity, Urine: 1.02 (ref 1.005–1.030)
pH: 5.5 (ref 5.0–8.0)

## 2020-05-18 LAB — URINALYSIS, MICROSCOPIC (REFLEX)

## 2020-05-18 LAB — RESP PANEL BY RT-PCR (FLU A&B, COVID) ARPGX2
Influenza A by PCR: NEGATIVE
Influenza B by PCR: NEGATIVE
SARS Coronavirus 2 by RT PCR: NEGATIVE

## 2020-05-18 MED ORDER — SODIUM CHLORIDE 0.9 % IV BOLUS
1000.0000 mL | Freq: Once | INTRAVENOUS | Status: AC
  Administered 2020-05-18: 1000 mL via INTRAVENOUS

## 2020-05-18 MED ORDER — LACTATED RINGERS IV BOLUS
1000.0000 mL | Freq: Once | INTRAVENOUS | Status: AC
  Administered 2020-05-18: 1000 mL via INTRAVENOUS

## 2020-05-18 MED ORDER — LACTATED RINGERS IV SOLN: INTRAVENOUS | Status: DC

## 2020-05-18 NOTE — ED Notes (Signed)
Bladder Scan Volume reading >999. Attempted 2 times with same result. Pt abdomen firm and distended. Provider notified.

## 2020-05-18 NOTE — ED Notes (Signed)
Patient appears tired and weak.

## 2020-05-18 NOTE — Progress Notes (Signed)
Patient arrived to unit. Transferred successfully from stretcher to bed with no complaints or discomfort from patient. Patient in no acute distress. Alert to person, patient is pleasant and cooperative. Foley catheter that was inserted at previous facility is in place. Urine is cloudy, milk-red color. Very red with present obvious clots, otherwise no other complications. Doctor will be notified.

## 2020-05-18 NOTE — Progress Notes (Signed)
[facility] to [facility] transfer:  Patient with h/o HTN; HLD; dementia; COPD; and BPH presenting with AMS and progressive weakness x 4-5 days.  Baseline dementia but still able to walk and interact until as recently as a week ago.  Creatinine is 12.32, Na++ 157.  Nephrology recommends IVF and transfer to [facility].  He has been enrolled in palliative care. Will admit to telemetry, recommend palliative care consultation in addition to rehydration.   Georgana Curio, M.D.

## 2020-05-18 NOTE — ED Notes (Addendum)
Placed on lengthy hold while attempting to call report. Not able to give report.

## 2020-05-18 NOTE — ED Triage Notes (Signed)
Possible UTI. Weakness x 3 days. Decreased po intake for "a while" per son.

## 2020-05-18 NOTE — ED Provider Notes (Addendum)
[facility] EMERGENCY DEPARTMENT Provider Note   CSN: [id number] Arrival date & time: 05/18/20  1304     History Chief Complaint  Patient presents with  . Weakness    Clarence Padilla is a 86 y.o. male.  HPI 86 year old male with a history of COPD, dementia, hyperlipidemia, hypertension presents to the ER with his son with complaints of altered mental status and progressive weakness.  Level 5 caveat secondary to the patient's dementia.  Son at bedside reports that the patient has been progressively eating less, being more confused, and has had significant weakness with walking over the last 5 or 6 days.  He called the patient's PCP on Monday and it was thought that he has a UTI.  Patient's son states that the patient was on antibiotics for UTI approximately a month ago.  He started Cipro on Tuesday, however weakness and confusion continues to progress.  Patient son also notes some slurred speech.  Patient does have dementia at baseline, for me he is alert to person, but not place or time which the patient's son states that this is normal for him. Has not noticed any fevers. No facial droop. No prior history of stroke. Not on blood thinners, no recent falls or head injuries.  Patient son states that he had a PCP appointment today but decided to bring him to the ER instead to be evaluated.    Past Medical History:  Diagnosis Date  . BPH (benign prostatic hyperplasia)   . COPD (chronic obstructive pulmonary disease) (HCC)   . Dementia (HCC)   . Hyperlipidemia 07/15/12  . Hypertension     Patient Active Problem List   Diagnosis Date Noted  . Acute renal failure (ARF) (HCC) 05/18/2020  . Hyperlipidemia     Past Surgical History:  Procedure Laterality Date  . CATARACT EXTRACTION W/PHACO Left 05/12/2013   Procedure: CATARACT EXTRACTION PHACO AND INTRAOCULAR LENS PLACEMENT (IOC);  Surgeon: Shade FloodGreer Geiger, MD;  [facility];  Service: Ophthalmology;  Laterality: Left;        No family history on file.  Social History   Tobacco Use  . Smoking status: Current Every Day Smoker    Packs/day: 1.00    Types: Cigarettes  . Smokeless tobacco: Never Used  Substance Use Topics  . Alcohol use: No    Home Medications Prior to Admission medications   Medication Sig Start Date End Date Taking? Authorizing Provider  aspirin 81 MG chewable tablet Chew 81 mg by mouth daily.   Yes [provider]  donepezil (ARICEPT) 10 MG tablet Take 10 mg by mouth at bedtime.   Yes [provider]  memantine (NAMENDA) 10 MG tablet Take 10 mg by mouth daily. 03/29/19  Yes [provider]  montelukast (SINGULAIR) 10 MG tablet Take 10 mg by mouth daily. 01/11/20  Yes [provider]  mupirocin ointment (BACTROBAN) 2 % Apply 1 application topically 2 (two) times daily. 08/26/18  Yes Yu, Amy V, PA-C  rosuvastatin (CRESTOR) 5 MG tablet Take 5 mg by mouth daily. 01/29/19  Yes [provider]  Tamsulosin HCl (FLOMAX) 0.4 MG CAPS Take 0.8 mg by mouth daily.    Yes [provider]    Allergies    Lovastatin  Review of Systems   Review of Systems  Unable to perform ROS: Dementia    Physical Exam Updated Vital Signs BP (!) 143/77   Pulse (!) 57   Temp (!) 97.5 F (36.4 C) (Oral)  Resp 12   Ht 6\' 5"  (1.956 m)   Wt 60.3 kg   SpO2 100%   BMI 15.77 kg/m   Physical Exam Vitals and nursing note reviewed.  Constitutional:      General: He is not in acute distress.    Appearance: He is well-developed and well-nourished. He is not toxic-appearing or diaphoretic.     Comments: Frail appearing   HENT:     Head: Normocephalic and atraumatic.     Mouth/Throat:     Mouth: Mucous membranes are moist.  Eyes:     Conjunctiva/sclera: Conjunctivae normal.  Cardiovascular:     Rate and Rhythm: Normal rate and regular rhythm.     Pulses: Normal pulses.     Heart sounds: Normal heart sounds. No murmur heard.   Pulmonary:      Effort: Pulmonary effort is normal. No respiratory distress.     Breath sounds: Normal breath sounds.  Abdominal:     General: Abdomen is flat. There is distension.     Palpations: Abdomen is soft.     Tenderness: There is no abdominal tenderness. There is no right CVA tenderness or left CVA tenderness.       Comments: Lower abdomen hard and distended   Musculoskeletal:        General: No edema. Normal range of motion.     Cervical back: Neck supple.     Right lower leg: No edema.     Left lower leg: No edema.  Skin:    General: Skin is warm and dry.     Findings: No erythema.  Neurological:     General: No focal deficit present.     Mental Status: He is alert.     Sensory: No sensory deficit.     Motor: No weakness.     Comments: Neuro exam difficult to assess given the patient's dementia.  He will follow some basic commands.  Alert to person, but not place or time.  Equal grip strength in upper extremities, equal strength in lower extremities.  Will not lift both legs.  No noticeable facial droop.  Left pupil larger than left, non reactive. Speech without slurring. Smile equal. Will not raise eyebrows, unclear if he understands the command or not   Psychiatric:        Mood and Affect: Mood and affect and mood normal.        Behavior: Behavior normal.     ED Results / Procedures / Treatments   Labs (all labs ordered are listed, but only abnormal results are displayed) Labs Reviewed  COMPREHENSIVE METABOLIC PANEL - Abnormal; Notable for the following components:      Result Value   Sodium 157 (*)    Chloride 120 (*)    CO2 17 (*)    Glucose, Bld 121 (*)    BUN 192 (*)    Creatinine, Ser 12.32 (*)    GFR, Estimated 4 (*)    Anion gap 20 (*)    All other components within normal limits  RESP PANEL BY RT-PCR (FLU A&B, COVID) ARPGX2  CBC WITH DIFFERENTIAL/PLATELET  URINALYSIS, ROUTINE W REFLEX MICROSCOPIC    EKG None  Radiology CT Head Wo Contrast  Result Date:  05/18/2020 CLINICAL DATA:  Mental status changes. EXAM: CT HEAD WITHOUT CONTRAST TECHNIQUE: Contiguous axial images were obtained from the base of the skull through the vertex without intravenous contrast. COMPARISON:  03/13/2016 FINDINGS: Brain: There is atrophy and chronic small vessel disease changes. No acute  intracranial abnormality. Specifically, no hemorrhage, hydrocephalus, mass lesion, acute infarction, or significant intracranial injury. Vascular: No hyperdense vessel or unexpected calcification. Skull: No acute calvarial abnormality. Sinuses/Orbits: Visualized paranasal sinuses and mastoids clear. Orbital soft tissues unremarkable. Other: None IMPRESSION: Atrophy, chronic microvascular disease. No acute intracranial abnormality. Electronically Signed   By: Charlett Nose M.D.   On: 05/18/2020 15:46   DG Chest Portable 1 View  Result Date: 05/18/2020 CLINICAL DATA:  Altered mental status EXAM: PORTABLE CHEST 1 VIEW COMPARISON:  07/22/2018, CT 11/22/2019 FINDINGS: Emphysematous disease. No focal opacity or pleural effusion. Normal cardiomediastinal silhouette. No pneumothorax. IMPRESSION: No active disease. Emphysematous disease. Electronically Signed   By: Jasmine Pang M.D.   On: 05/18/2020 16:03    Procedures .Critical Care Performed by: Mare Ferrari, PA-C Authorized by: Mare Ferrari, PA-C   Critical care provider statement:    Critical care time (minutes):  32   Critical care was necessary to treat or prevent imminent or life-threatening deterioration of the following conditions:  Renal failure   Critical care was time spent personally by me on the following activities:  Discussions with consultants, evaluation of patient's response to treatment, examination of patient, ordering and performing treatments and interventions, ordering and review of laboratory studies, ordering and review of radiographic studies, pulse oximetry, re-evaluation of patient's condition, obtaining history from  patient or surrogate and review of old charts   (including critical care time)  Medications Ordered in ED Medications  lactated ringers infusion (has no administration in time range)  lactated ringers bolus 1,000 mL (0 mLs Intravenous Stopped 05/18/20 1715)  sodium chloride 0.9 % bolus 1,000 mL (0 mLs Intravenous Stopped 05/18/20 1715)    ED Course  I have reviewed the triage vital signs and the nursing notes.  Pertinent labs & imaging results that were available during my care of the patient were reviewed by me and considered in my medical decision making (see chart for details).    MDM Rules/Calculators/A&P                         86 year old male who presents to the ER with complaints of worsening weakness, altered mental status On presentation, he is alert, nontoxic-appearing, no acute distress.  Alert to person but not place or time which per son is at baseline.  Neuro exam is largely unremarkable, however difficult to assess given patient's level of dementia.  He does have a left pupil larger than the right, does have a history of cataracts. Lower abdomen is hardened and distended Vitals on arrival overall reassuring.  The differential diagnosis for AMS is extensive and includes, but is not limited to: drug overdose - opioids, alcohol, sedatives, antipsychotics, drug withdrawal, others; Metabolic: hypoxia, hypoglycemia, hyperglycemia, hypercalcemia, hypernatremia, hyponatremia, uremia, hepatic encephalopathy, hypothyroidism, hyperthyroidism, vitamin B12 or thiamine deficiency, carbon monoxide poisoning, Wilson's disease, Lactic acidosis, DKA/HHOS; Infectious: meningitis, encephalitis, bacteremia/sepsis, urinary tract infection, pneumonia, neurosyphilis; Structural: Space-occupying lesion, (brain tumor, subdural hematoma, hydrocephalus,); Vascular: stroke, subarachnoid hemorrhage, coronary ischemia, hypertensive encephalopathy, CNS vasculitis, thrombotic thrombocytopenic purpura,  disseminated intravascular coagulation, hyperviscosity; Psychiatric: Schizophrenia, depression; Other: Seizure, hypothermia, heat stroke, ICU psychosis, dementia -"sundowning."  Labs reviewed and interpreted by me -CBC unremarkable -CMP with hyponatremia of 157, creatinine of 12.32 and a BUN of 192. Chloride of 120 and CO2 of 17 with an elevated anion gap of 20. Suspect this is all secondary to acute renal failure. As per discussion with the son, there is no history of this. -  Covid is negative UA pending  This imaging reviewed by myself and radiology -CT head with no evidence of acute stroke or bleed -Chest x-ray without evidence of pneumonia   MDM: Patient with new onset acute renal failure. Consulted nephrology who would like to initiate fluids, and transfer to [facility] for possible dialysis.  Bladder scan showed 1L of urinei n bladder, inserted foley catheter. Consulted Dr. Kevan Ny with the hospitalist team who will admit the patient. Patient remains hemodynamically stable at this time.  This was a shared visit with my supervising physician Dr. Clarice Pole who independently saw and evaluated the patient & provided guidance in evaluation/management/disposition ,in agreement with care   Final Clinical Impression(s) / ED Diagnoses Final diagnoses:  AKI (acute kidney injury) (HCC)  Weakness    Rx / DC Orders ED Discharge Orders    None           Arby Barrette, MD 05/18/20 1653    Mare Ferrari, PA-C 05/18/20 1725    Arby Barrette, MD 05/19/20 [id number]

## 2020-05-18 NOTE — ED Notes (Signed)
Urine changed to red kool aid color

## 2020-05-18 NOTE — ED Notes (Signed)
Attempted to call report. Staff not able to take report.

## 2020-05-19 ENCOUNTER — Inpatient Hospital Stay (HOSPITAL_COMMUNITY): Payer: Medicare HMO

## 2020-05-19 DIAGNOSIS — R338 Other retention of urine: Secondary | ICD-10-CM | POA: Diagnosis present

## 2020-05-19 DIAGNOSIS — F0390 Unspecified dementia without behavioral disturbance: Secondary | ICD-10-CM | POA: Diagnosis present

## 2020-05-19 DIAGNOSIS — G9341 Metabolic encephalopathy: Secondary | ICD-10-CM | POA: Diagnosis present

## 2020-05-19 DIAGNOSIS — R310 Gross hematuria: Secondary | ICD-10-CM | POA: Diagnosis present

## 2020-05-19 DIAGNOSIS — E870 Hyperosmolality and hypernatremia: Secondary | ICD-10-CM | POA: Diagnosis present

## 2020-05-19 LAB — BASIC METABOLIC PANEL
Anion gap: 20 — ABNORMAL HIGH (ref 5–15)
Anion gap: 18 — ABNORMAL HIGH (ref 5–15)
Anion gap: 18 — ABNORMAL HIGH (ref 5–15)
BUN: 168 mg/dL — ABNORMAL HIGH (ref 8–23)
BUN: 161 mg/dL — ABNORMAL HIGH (ref 8–23)
BUN: 154 mg/dL — ABNORMAL HIGH (ref 8–23)
CO2: 17 mmol/L — ABNORMAL LOW (ref 22–32)
CO2: 17 mmol/L — ABNORMAL LOW (ref 22–32)
CO2: 17 mmol/L — ABNORMAL LOW (ref 22–32)
Calcium: 8.7 mg/dL — ABNORMAL LOW (ref 8.9–10.3)
Calcium: 8.4 mg/dL — ABNORMAL LOW (ref 8.9–10.3)
Calcium: 8.2 mg/dL — ABNORMAL LOW (ref 8.9–10.3)
Chloride: 121 mmol/L — ABNORMAL HIGH (ref 98–111)
Chloride: 124 mmol/L — ABNORMAL HIGH (ref 98–111)
Chloride: 123 mmol/L — ABNORMAL HIGH (ref 98–111)
Creatinine, Ser: 10.68 mg/dL — ABNORMAL HIGH (ref 0.61–1.24)
Creatinine, Ser: 9.73 mg/dL — ABNORMAL HIGH (ref 0.61–1.24)
Creatinine, Ser: 9.1 mg/dL — ABNORMAL HIGH (ref 0.61–1.24)
GFR, Estimated: 4 mL/min — ABNORMAL LOW (ref >60)
GFR, Estimated: 5 mL/min — ABNORMAL LOW (ref >60)
GFR, Estimated: 5 mL/min — ABNORMAL LOW (ref >60)
Glucose, Bld: 119 mg/dL — ABNORMAL HIGH (ref 70–99)
Glucose, Bld: 156 mg/dL — ABNORMAL HIGH (ref 70–99)
Glucose, Bld: 135 mg/dL — ABNORMAL HIGH (ref 70–99)
Potassium: 3.7 mmol/L (ref 3.5–5.1)
Potassium: 3.7 mmol/L (ref 3.5–5.1)
Potassium: 3.5 mmol/L (ref 3.5–5.1)
Sodium: 158 mmol/L — ABNORMAL HIGH (ref 135–145)
Sodium: 159 mmol/L — ABNORMAL HIGH (ref 135–145)
Sodium: 158 mmol/L — ABNORMAL HIGH (ref 135–145)

## 2020-05-19 LAB — CBC
HCT: 38.3 % — ABNORMAL LOW (ref 39.0–52.0)
Hemoglobin: 12.2 g/dL — ABNORMAL LOW (ref 13.0–17.0)
MCH: 29.8 pg (ref 26.0–34.0)
MCHC: 31.9 g/dL (ref 30.0–36.0)
MCV: 93.6 fL (ref 80.0–100.0)
Platelets: 140 10*3/uL — ABNORMAL LOW (ref 150–400)
RBC: 4.09 MIL/uL — ABNORMAL LOW (ref 4.22–5.81)
RDW: 14.5 % (ref 11.5–15.5)
WBC: 4.6 10*3/uL (ref 4.0–10.5)
nRBC: 0 % (ref 0.0–0.2)

## 2020-05-19 LAB — BASIC METABOLIC PANEL WITH GFR
Anion gap: 19 — ABNORMAL HIGH (ref 5–15)
BUN: 163 mg/dL — ABNORMAL HIGH (ref 8–23)
CO2: 16 mmol/L — ABNORMAL LOW (ref 22–32)
Calcium: 8.2 mg/dL — ABNORMAL LOW (ref 8.9–10.3)
Chloride: 123 mmol/L — ABNORMAL HIGH (ref 98–111)
Creatinine, Ser: 9.79 mg/dL — ABNORMAL HIGH (ref 0.61–1.24)
GFR, Estimated: 5 mL/min — ABNORMAL LOW (ref >60)
Glucose, Bld: 123 mg/dL — ABNORMAL HIGH (ref 70–99)
Potassium: 4.2 mmol/L (ref 3.5–5.1)
Sodium: 158 mmol/L — ABNORMAL HIGH (ref 135–145)

## 2020-05-19 LAB — LACTIC ACID, PLASMA: Lactic Acid, Venous: 1.2 mmol/L (ref 0.5–1.9)

## 2020-05-19 MED ORDER — TAMSULOSIN HCL 0.4 MG PO CAPS
0.8000 mg | ORAL_CAPSULE | Freq: Every day | ORAL | Status: DC
  Administered 2020-05-20 – 2020-05-21 (×2): 0.8 mg via ORAL
  Filled 2020-05-19 (×2): qty 2

## 2020-05-19 MED ORDER — ACETAMINOPHEN 650 MG RE SUPP: 650.0000 mg | Freq: Four times a day (QID) | RECTAL | Status: DC | PRN

## 2020-05-19 MED ORDER — DONEPEZIL HCL 10 MG PO TABS
10.0000 mg | ORAL_TABLET | Freq: Every day | ORAL | Status: DC
  Administered 2020-05-19 – 2020-05-20 (×2): 10 mg via ORAL
  Filled 2020-05-19 (×2): qty 1

## 2020-05-19 MED ORDER — MONTELUKAST SODIUM 10 MG PO TABS
10.0000 mg | ORAL_TABLET | Freq: Every day | ORAL | Status: DC
  Administered 2020-05-20 – 2020-05-21 (×2): 10 mg via ORAL
  Filled 2020-05-19 (×2): qty 1

## 2020-05-19 MED ORDER — MEMANTINE HCL 10 MG PO TABS
10.0000 mg | ORAL_TABLET | Freq: Every day | ORAL | Status: DC
  Administered 2020-05-20 – 2020-05-21 (×2): 10 mg via ORAL
  Filled 2020-05-19 (×2): qty 1

## 2020-05-19 MED ORDER — ONDANSETRON HCL 4 MG/2ML IJ SOLN: 4.0000 mg | Freq: Four times a day (QID) | INTRAMUSCULAR | Status: DC | PRN

## 2020-05-19 MED ORDER — ACETAMINOPHEN 325 MG PO TABS: 650.0000 mg | ORAL_TABLET | Freq: Four times a day (QID) | ORAL | Status: DC | PRN

## 2020-05-19 MED ORDER — SODIUM CHLORIDE 0.9 % IV SOLN
1.0000 g | INTRAVENOUS | Status: DC
  Administered 2020-05-19 – 2020-05-21 (×3): 1 g via INTRAVENOUS
  Filled 2020-05-19: qty 10
  Filled 2020-05-19: qty 1
  Filled 2020-05-19: qty 10

## 2020-05-19 MED ORDER — FINASTERIDE 5 MG PO TABS
5.0000 mg | ORAL_TABLET | Freq: Every day | ORAL | Status: DC
  Administered 2020-05-20 – 2020-05-21 (×2): 5 mg via ORAL
  Filled 2020-05-19 (×2): qty 1

## 2020-05-19 MED ORDER — SODIUM CHLORIDE 0.45 % IV SOLN: INTRAVENOUS | Status: DC

## 2020-05-19 MED ORDER — ONDANSETRON HCL 4 MG PO TABS: 4.0000 mg | ORAL_TABLET | Freq: Four times a day (QID) | ORAL | Status: DC | PRN

## 2020-05-19 MED ORDER — SODIUM BICARBONATE 650 MG PO TABS
1300.0000 mg | ORAL_TABLET | Freq: Three times a day (TID) | ORAL | Status: DC
  Administered 2020-05-19 (×3): 1300 mg via ORAL
  Filled 2020-05-19 (×3): qty 2

## 2020-05-19 MED ORDER — ROSUVASTATIN CALCIUM 5 MG PO TABS
5.0000 mg | ORAL_TABLET | Freq: Every day | ORAL | Status: DC
  Administered 2020-05-20 – 2020-05-21 (×2): 5 mg via ORAL
  Filled 2020-05-19 (×2): qty 1

## 2020-05-19 NOTE — Progress Notes (Signed)
PROGRESS NOTE        PATIENT DETAILS Name: Clarence Padilla Age: 86 y.o. Sex: male Date of Birth: 1934/04/27 Admit Date: 05/18/2020 Admitting Physician Clarence BlueJennifer Yates, MD WUJ:WJXBPCP:Dean, Lind GuestEric L, MD  Brief Narrative: Patient is a 86 y.o. male with dementia, HTN, BPH, HLD-who presented with altered mental status-upon further evaluation-was found to have acute renal failure due to obstructive uropathy.  See below for further details  Significant events: 12/9>> [facility] with confusion-found to have ARF due to obstructive uropathy  Significant studies: 12/9>> CT head: No acute intracranial abnormality. 12/9>> chest x-ray: No pneumonia  Antimicrobial therapy: Rocephin: 12/10>>  Microbiology data: None  Procedures : None  Consults: Nephrology  DVT Prophylaxis : SCDs Start: 05/19/20 0205   Subjective: Pleasantly confused-but following some commands.  Hematuria clearing-pink-colored urine in Foley.   Assessment/Plan: ARF due to obstructive uropathy: Foley catheter in place-significant urine output/postoperative diuresis-increase IVF to 125 cc an hour.  Resume Flomax-given hematuria add finasteride.  Hematuria: Probably secondary to BPH-but could have UTI-start IV Rocephin for a few days.  Follow and adjust.  Acute metabolic encephalopathy: Secondary to uremia-should improve with improvement in renal function.  Hypernatremia: Secondary to poor oral intake-on half-normal saline-recheck electrolytes  Dementia: Appears to be advanced-continue Aricept/Namenda-expect some amount of delirium during hospital stay  BPH: We will be starting on both Flomax and finasteride.  ?  Left-sided weakness: Patient's son claims that yesterday patient had some left-sided weakness-very hard to discern any weakness today but seems to be moving all 4 extremities symmetrically.  CT head was negative for acute abnormalities.  He could have had some neurological  manifestations due to severity of uremia-patient will not be able to lie still for an MRI given his metabolic encephalopathy-high risk for adverse events if MRI attempted under sedation at this point-furthermore-suspicion for stroke is low as well based on my exam this morning.  After discussion with son-we have deferred any further neuroimaging including MRI for now-hopefully his kidney function and encephalopathy will improve-at that point if he still manifests left-sided weakness-we can pursue further imaging.  Goals of care: Continue treatment with IV fluids/IV antibiotics-patient son understands that patient is not a dialysis candidate-explained that given severity of AKI and other issues noted above-that putting him to resuscitation in the event of a cardiac arrest with not be in the patient's best interest.  Patient son Clarence Padilla-DNR orders entered.  Family aware that if patient's renal function does not improve-apart from initiating hospice care we really do not have any other options.   Diet: Diet Order            Diet Heart Room service appropriate? Yes; Fluid consistency: Thin  Diet effective now                  Code Status: Full code or DNR  Family Communication: Spoke with son Clarence Padilla(Enrigue [phone]) over the phone on 12/10  Disposition Plan: Status is: Inpatient  Remains inpatient appropriate because:Inpatient level of care appropriate due to severity of illness   Dispo: The patient is from: Home              Anticipated d/c is to: TBD              Anticipated d/c date is: > 3 days  Patient currently is not medically stable to d/c.    Barriers to Discharge: Severe encephalopathy due to ARF in the setting of obstructive uropathy-needs continued inpatient monitoring of renal function.  Antimicrobial agents: Anti-infectives (From admission, onward)   Start     Dose/Rate Route Frequency Ordered Stop   05/19/20 0900  cefTRIAXone (ROCEPHIN) 1 g in sodium chloride  0.9 % 100 mL IVPB        1 g 200 mL/hr over 30 Minutes Intravenous Every 24 hours 05/19/20 0802         Time spent: 35 minutes-Greater than 50% of this time was spent in counseling, explanation of diagnosis, planning of further management, and coordination of care.  MEDICATIONS: Scheduled Meds: . sodium bicarbonate  1,300 mg Oral TID   Continuous Infusions: . sodium chloride 125 mL/hr at 05/19/20 0800  . cefTRIAXone (ROCEPHIN)  IV 1 g (05/19/20 0940)   PRN Meds:.acetaminophen **OR** acetaminophen, ondansetron **OR** ondansetron (ZOFRAN) IV   PHYSICAL EXAM: Vital signs: Vitals:   05/18/20 1900 05/18/20 2000 05/18/20 2205 05/19/20 0603  BP: (!) 144/62 129/83 (!) 141/73 103/61  Pulse: 66 73  85  Resp: 14 13  16   Temp:   98.6 F (37 C) 98.7 F (37.1 C)  TempSrc:   Oral Oral  SpO2: 100% 100%  99%  Weight:      Height:       Filed Weights   05/18/20 1320  Weight: 60.3 kg   Body mass index is 15.77 kg/m.   Gen Exam: Confused but following some commands HEENT:atraumatic, normocephalic Chest: B/Padilla clear to auscultation anteriorly CVS:S1S2 regular Abdomen:soft non tender, non distended Extremities:no edema Neurology: Appears to be moving all 4 extremities-very difficult exam given mental status. Skin: no rash  I have personally reviewed following labs and imaging studies  LABORATORY DATA: CBC: Recent Labs  Lab 05/18/20 1345 05/19/20 0607  WBC 6.4 4.6  NEUTROABS 4.7  --   HGB 14.7 12.2*  HCT 45.0 38.3*  MCV 93.2 93.6  PLT 177 140*    Basic Metabolic Panel: Recent Labs  Lab 05/18/20 1345 05/19/20 0159 05/19/20 0607  NA 157* 158* 158*  K 4.1 3.7 4.2  CL 120* 121* 123*  CO2 17* 17* 16*  GLUCOSE 121* 119* 123*  BUN 192* 168* 163*  CREATININE 12.32* 10.68* 9.79*  CALCIUM 9.0 8.7* 8.2*    GFR: Estimated Creatinine Clearance: 4.8 mL/min (A) (by C-G formula based on SCr of 9.79 mg/dL (H)).  Liver Function Tests: Recent Labs  Lab 05/18/20 1345   AST 22  ALT 28  ALKPHOS 65  BILITOT 0.8  PROT 7.3  ALBUMIN 4.0   No results for input(s): LIPASE, AMYLASE in the last 168 hours. No results for input(s): AMMONIA in the last 168 hours.  Coagulation Profile: No results for input(s): INR, PROTIME in the last 168 hours.  Cardiac Enzymes: No results for input(s): CKTOTAL, CKMB, CKMBINDEX, TROPONINI in the last 168 hours.  BNP (last 3 results) No results for input(s): PROBNP in the last 8760 hours.  Lipid Profile: No results for input(s): CHOL, HDL, LDLCALC, TRIG, CHOLHDL, LDLDIRECT in the last 72 hours.  Thyroid Function Tests: No results for input(s): TSH, T4TOTAL, FREET4, T3FREE, THYROIDAB in the last 72 hours.  Anemia Panel: No results for input(s): VITAMINB12, FOLATE, FERRITIN, TIBC, IRON, RETICCTPCT in the last 72 hours.  Urine analysis:    Component Value Date/Time   COLORURINE YELLOW 05/18/2020 1541   APPEARANCEUR CLOUDY (A) 05/18/2020 1541   LABSPEC  1.020 05/18/2020 1541   PHURINE 5.5 05/18/2020 1541   GLUCOSEU NEGATIVE 05/18/2020 1541   HGBUR MODERATE (A) 05/18/2020 1541   BILIRUBINUR NEGATIVE 05/18/2020 1541   KETONESUR NEGATIVE 05/18/2020 1541   PROTEINUR 30 (A) 05/18/2020 1541   NITRITE NEGATIVE 05/18/2020 1541   LEUKOCYTESUR SMALL (A) 05/18/2020 1541    Sepsis Labs: Lactic Acid, Venous    Component Value Date/Time   LATICACIDVEN 1.2 05/19/2020 0607    MICROBIOLOGY: Recent Results (from the past 240 hour(s))  Resp Panel by RT-PCR (Flu A&B, Covid) Nasopharyngeal Swab     Status: None   Collection Time: 05/18/20  3:41 PM   Specimen: Nasopharyngeal Swab; Nasopharyngeal(NP) swabs in vial transport medium  Result Value Ref Range Status   SARS Coronavirus 2 by RT PCR NEGATIVE NEGATIVE Final    Comment: (NOTE) SARS-CoV-2 target nucleic acids are NOT DETECTED.  The SARS-CoV-2 RNA is generally detectable in upper respiratory specimens during the acute phase of infection. The lowest concentration of  SARS-CoV-2 viral copies this assay can detect is 138 copies/mL. A negative result does not preclude SARS-Cov-2 infection and should not be used as the sole basis for treatment or other patient management decisions. A negative result may occur with  improper specimen collection/handling, submission of specimen other than nasopharyngeal swab, presence of viral mutation(s) within the areas targeted by this assay, and inadequate number of viral copies(<138 copies/mL). A negative result must be combined with clinical observations, patient history, and epidemiological information. The expected result is Negative.  Fact Sheet for Patients:  BloggerCourse.com  Fact Sheet for Healthcare Providers:  SeriousBroker.it  This test is no t yet approved or cleared by the [place] FDA and  has been authorized for detection and/or diagnosis of SARS-CoV-2 by FDA under an Emergency Use Authorization (EUA). This EUA will remain  in effect (meaning this test can be used) for the duration of the COVID-19 declaration under Section 564(b)(1) of the Act, 21 U.S.C.section 360bbb-3(b)(1), unless the authorization is terminated  or revoked sooner.       Influenza A by PCR NEGATIVE NEGATIVE Final   Influenza B by PCR NEGATIVE NEGATIVE Final    Comment: (NOTE) The Xpert Xpress SARS-CoV-2/FLU/RSV plus assay is intended as an aid in the diagnosis of influenza from Nasopharyngeal swab specimens and should not be used as a sole basis for treatment. Nasal washings and aspirates are unacceptable for Xpert Xpress SARS-CoV-2/FLU/RSV testing.  Fact Sheet for Patients: BloggerCourse.com  Fact Sheet for Healthcare Providers: SeriousBroker.it  This test is not yet approved or cleared by the [place] FDA and has been authorized for detection and/or diagnosis of SARS-CoV-2 by FDA under an Emergency Use  Authorization (EUA). This EUA will remain in effect (meaning this test can be used) for the duration of the COVID-19 declaration under Section 564(b)(1) of the Act, 21 U.S.C. section 360bbb-3(b)(1), unless the authorization is terminated or revoked.  Performed at [facility], [address], [place], [place] [address]     RADIOLOGY STUDIES/RESULTS: CT Head Wo Contrast  Result Date: 05/18/2020 CLINICAL DATA:  Mental status changes. EXAM: CT HEAD WITHOUT CONTRAST TECHNIQUE: Contiguous axial images were obtained from the base of the skull through the vertex without intravenous contrast. COMPARISON:  03/13/2016 FINDINGS: Brain: There is atrophy and chronic small vessel disease changes. No acute intracranial abnormality. Specifically, no hemorrhage, hydrocephalus, mass lesion, acute infarction, or significant intracranial injury. Vascular: No hyperdense vessel or unexpected calcification. Skull: No acute calvarial abnormality. Sinuses/Orbits: Visualized paranasal  sinuses and mastoids clear. Orbital soft tissues unremarkable. Other: None IMPRESSION: Atrophy, chronic microvascular disease. No acute intracranial abnormality. Electronically Signed   By: Charlett Nose M.D.   On: 05/18/2020 15:46   US RENAL  Result Date: 05/19/2020 CLINICAL DATA:  Acute kidney injury EXAM: RENAL / URINARY TRACT ULTRASOUND COMPLETE COMPARISON:  None. FINDINGS: Right Kidney: Renal measurements: 10.5 x 7.3 x 5.2 cm = volume: 209 mL. Mild to moderate hydronephrosis. The renal parenchyma is echogenic. Left Kidney: Renal measurements: 11.9 x 6.5 x 6.7 cm = volume: 271 mL. Moderate to severe hydronephrosis. The renal parenchyma is echogenic in appearance. An echogenic structure with posterior acoustic shadowing measuring up to 0.8 cm is identified in the lower pole collecting system. There is positive color comet tail artifact. This is consistent with nephrolithiasis. Circumscribed anechoic lesion with minimal internal  debris present in the interpolar region measuring 1.7 x 1.4 by 1.6 cm. Bladder: A Foley catheter is present within the bladder. The bladder wall appears diffusely thickened with multifocal punctate echogenic foci. Additionally, the prostate gland is markedly enlarged. Other: None. IMPRESSION: 1. Bilateral hydronephrosis, mild to moderate on the right and moderate to severe on the left. This suggests bilateral distal obstruction. Recommend further evaluation with CT scan of the abdomen and pelvis. 2. Abnormal appearance of the bladder which is diffusely thick walled. The wall also appears irregular and contains multifocal punctate echogenic foci. Differential considerations include chronic trabeculation related to bladder outlet obstruction, chronic inflammatory cystitis, and bladder malignancy. Cystoscopy could further evaluate if clinically warranted. 3. Marked prostatomegaly. 4. Echogenic renal parenchyma bilaterally consistent with underlying medical renal disease. Electronically Signed   By: Malachy Moan M.D.   On: 05/19/2020 10:41   DG Chest Portable 1 View  Result Date: 05/18/2020 CLINICAL DATA:  Altered mental status EXAM: PORTABLE CHEST 1 VIEW COMPARISON:  07/22/2018, CT 11/22/2019 FINDINGS: Emphysematous disease. No focal opacity or pleural effusion. Normal cardiomediastinal silhouette. No pneumothorax. IMPRESSION: No active disease. Emphysematous disease. Electronically Signed   By: Jasmine Pang M.D.   On: 05/18/2020 16:03     LOS: 1 day   Jeoffrey Massed, MD  [facility]    To contact the attending provider between 7A-7P or the covering provider during after hours 7P-7A, please log into the web site [email or web address] and access using universal [place] password for that web site. If you do not have the password, please call the hospital operator.  05/19/2020, 11:08 AM

## 2020-05-19 NOTE — Evaluation (Signed)
Physical Therapy Evaluation Patient Details Name: Clarence Padilla MRN: [id number] DOB: [birth year] Today's Date: 05/19/2020   History of Present Illness  Pt is a 86 y.o. male with dementia, HTN, BPH, HLD-who presented with altered mental status-upon further evaluation-was found to have acute renal failure due to obstructive uropathy and metabolic encephalopathy.  There was report of L sided weakness but CT was negative and unable to lie still for MRI.  Clinical Impression  Pt admitted with above diagnosis. Pt with history of dementia but is normally fairly independent and mobile.  He does live with his son and has 24 hr supervision.  He does have several steps to contend with at home.  During PT evaluation, pt was limited by lethargy, limited ability to follow commands, and orthostatic hypotension. Pt was only able to stand at EOB with mod Ax2 and then returned to supine.  Son is open to SNF at discharge if mobility does not improve. Pt currently with functional limitations due to the deficits listed below (see PT Problem List). Pt will benefit from skilled PT to increase their independence and safety with mobility to allow discharge to the venue listed below.       Follow Up Recommendations SNF    Equipment Recommendations  Wheelchair cushion (measurements PT);Rolling walker with 5" wheels;3in1 (PT);Wheelchair (measurements PT)    Recommendations for Other Services       Precautions / Restrictions Precautions Precautions: Fall Precaution Comments: orthostatic hypotension      Mobility  Bed Mobility Overal bed mobility: Needs Assistance Bed Mobility: Supine to Sit;Sit to Supine     Supine to sit: Mod assist Sit to supine: Mod assist;+2 for safety/equipment   General bed mobility comments: Pt lethargic and not following commands.  HOB elevated and assisted pt's legs to EOB, he was then able to activate trunk and assist with coming to EOB.  Son assisted with returning pt to supine by  guiding trunk and PT assisted leg    Transfers Overall transfer level: Needs assistance Equipment used: 2 person hand held assist Transfers: Sit to/from Stand Sit to Stand: Mod assist;+2 physical assistance         General transfer comment: fatigued, mod A x 2 to boost up to partial stand - pt unable to fully stand  Ambulation/Gait             General Gait Details: unable  Stairs            Wheelchair Mobility    Modified Rankin (Stroke Patients Only)       Balance Overall balance assessment: Needs assistance Sitting-balance support: Bilateral upper extremity supported Sitting balance-Leahy Scale: Poor Sitting balance - Comments: Requiring UE support, leaning forward     Standing balance-Leahy Scale: Zero Standing balance comment: requiring mod A of 2 with heavy lean R                             Pertinent Vitals/Pain Pain Assessment: No/denies pain Faces Pain Scale: No hurt    Home Living Family/patient expects to be discharged to:: Private residence Living Arrangements: Children Available Help at Discharge: Available 24 hours/day Type of Home: House (townhome) Home Access: Stairs to enter Entrance Stairs-Rails: Right Entrance Stairs-Number of Steps: 2 Home Layout: Two level;Bed/bath upstairs Home Equipment: Other (comment) (walking stick)      Prior Function Level of Independence: Needs assistance   Gait / Transfers Assistance Needed: Pt able to ambulate community distances  wtihout AD  ADL's / Homemaking Assistance Needed: Pt was mostly independent with ADLs (very min assit with lower body dressing); family did IADLs  Comments: Pt with onset of weakness about a week ago.  No history of fall.     Hand Dominance        Extremity/Trunk Assessment   Upper Extremity Assessment Upper Extremity Assessment: Difficult to assess due to impaired cognition;Generalized weakness    Lower Extremity Assessment Lower Extremity  Assessment: Difficult to assess due to impaired cognition;Generalized weakness (Pt not following commands for MMT.  Did randomly move all extremities.)    Cervical / Trunk Assessment Cervical / Trunk Assessment: Normal  Communication   Communication: No difficulties  Cognition Arousal/Alertness: Lethargic Behavior During Therapy: Restless Overall Cognitive Status: Impaired/Different from baseline Area of Impairment: Orientation;Problem solving;Attention;Memory;Following commands;Safety/judgement;Awareness                 Orientation Level: Disoriented to;Person;Place;Time;Situation Current Attention Level: Focused Memory: Decreased recall of precautions;Decreased short-term memory Following Commands: Follows one step commands inconsistently Safety/Judgement: Decreased awareness of deficits;Decreased awareness of safety Awareness: Intellectual Problem Solving: Slow processing;Decreased initiation;Difficulty sequencing;Requires verbal cues;Requires tactile cues General Comments: Pt with history of dementia but son reports has worsened over the past week.  Today , pt largely limited by lethargy      General Comments General comments (skin integrity, edema, etc.): HR and O2 sats stable.  BP was 106/60 supine.  Pt was able to sit EOB with improved responsiveness, but then when standing became weak and less responsive.  Upon return to sitting bp was 79/48.  BP back to 90/65 in supine.  RN alerted.    Exercises     Assessment/Plan    PT Assessment Patient needs continued PT services  PT Problem List Decreased strength;Decreased mobility;Decreased safety awareness;Decreased range of motion;Decreased knowledge of precautions;Decreased activity tolerance;Cardiopulmonary status limiting activity;Decreased balance;Decreased knowledge of use of DME       PT Treatment Interventions DME instruction;Therapeutic activities;Gait training;Therapeutic exercise;Patient/family education;Cognitive  remediation;Balance training;Functional mobility training    PT Goals (Current goals can be found in the Care Plan section)  Acute Rehab PT Goals Patient Stated Goal: son is open to rehab at d/c PT Goal Formulation: With family Time For Goal Achievement: 06/02/20 Potential to Achieve Goals: Good    Frequency Min 2X/week   Barriers to discharge Inaccessible home environment      Co-evaluation               AM-PAC PT "6 Clicks" Mobility  Outcome Measure Help needed turning from your back to your side while in a flat bed without using bedrails?: A Lot Help needed moving from lying on your back to sitting on the side of a flat bed without using bedrails?: A Lot Help needed moving to and from a bed to a chair (including a wheelchair)?: A Lot Help needed standing up from a chair using your arms (e.g., wheelchair or bedside chair)?: A Lot Help needed to walk in hospital room?: Total Help needed climbing 3-5 steps with a railing? : Total 6 Click Score: 10    End of Session Equipment Utilized During Treatment: Gait belt Activity Tolerance: Patient limited by fatigue;Other (comment) (limited by orthostatic hypotension and lethargy) Patient left: in bed;with call bell/phone within reach;with bed alarm set;with family/visitor present Nurse Communication: Mobility status PT Visit Diagnosis: Other abnormalities of gait and mobility (R26.89);Unsteadiness on feet (R26.81);Muscle weakness (generalized) (M62.81)    Time: 3300-7622 PT Time Calculation (min) (ACUTE ONLY): 24  min   Charges:   PT Evaluation $PT Eval Moderate Complexity: 1 Mod PT Treatments $Therapeutic Activity: 8-22 mins        Anise Salvo, PT Acute Rehab Services Pager [phone] [facility] [phone]    Rayetta Humphrey 05/19/2020, 2:28 PM

## 2020-05-19 NOTE — H&P (Signed)
History and Physical    Clarence Padilla VHQ:[id number] DOB: 1934/04/28 DOA: 05/18/2020  PCP: Gwenyth Bender, MD  Patient coming from: Home  I have personally briefly reviewed patient's old medical records in [facility]  Chief Complaint: AMS, Generalized weakness  HPI: Clarence Padilla is a 86 y.o. male with medical history significant of COPD, dementia, HTN.  Pt is seeing paliative care as outpt at baseline though still able to walk and interact as of a week ago.  Pt presents to ED with son.  Pt with 4-5 day h/o AMS and progressively worsening generalized weakness.  Called PCP Monday who suspected UTI and started Cipro on Tues.  Symptoms have progressively worsened despite this.  At baseline apparently oriented to person but not place nor time.  No fevers, no falls, no head injuries, no facial droop, not on blood thinners.   ED Course: Sodium 157, Cl 120, bicarb 17, AG 20, creat 12.3 (up from 1.0 baseline), BUN 192.  The cause apparently is urinary retention / obstructive uropathy.  Foley placed and pt with 3L UOP prior to transfer.  After getting here to [facility] urine has become bloody with gross hematuria, additional 600cc UOP per my discussion with RN here.  LR going at 100cc/hr.   Review of Systems: Unable to perform, pt oriented only to self.  Past Medical History:  Diagnosis Date  . BPH (benign prostatic hyperplasia)   . COPD (chronic obstructive pulmonary disease) (HCC)   . Dementia (HCC)   . Hyperlipidemia 07/15/12  . Hypertension     Past Surgical History:  Procedure Laterality Date  . CATARACT EXTRACTION W/PHACO Left 05/12/2013   Procedure: CATARACT EXTRACTION PHACO AND INTRAOCULAR LENS PLACEMENT (IOC);  Surgeon: Shade Flood, MD;  Location: [facility] OR;  Service: Ophthalmology;  Laterality: Left;     reports that he has been smoking cigarettes. He has been smoking about 1.00 pack per day. He has never used smokeless tobacco. He reports that he does not drink alcohol. No  history on file for drug use.  Allergies  Allergen Reactions  . Lovastatin Nausea And Vomiting    No family history on file. Pt unable to provide family history due to severe dementia.  Prior to Admission medications   Medication Sig Start Date End Date Taking? Authorizing Provider  aspirin 81 MG chewable tablet Chew 81 mg by mouth daily.   Yes [provider]  donepezil (ARICEPT) 10 MG tablet Take 10 mg by mouth at bedtime.   Yes [provider]  memantine (NAMENDA) 10 MG tablet Take 10 mg by mouth daily. 03/29/19  Yes [provider]  montelukast (SINGULAIR) 10 MG tablet Take 10 mg by mouth daily. 01/11/20  Yes [provider]  mupirocin ointment (BACTROBAN) 2 % Apply 1 application topically 2 (two) times daily. 08/26/18  Yes Yu, Amy V, PA-C  rosuvastatin (CRESTOR) 5 MG tablet Take 5 mg by mouth daily. 01/29/19  Yes [provider]  Tamsulosin HCl (FLOMAX) 0.4 MG CAPS Take 0.8 mg by mouth daily.    Yes [provider]    Physical Exam: Vitals:   05/18/20 1800 05/18/20 1900 05/18/20 2000 05/18/20 2205  BP: (!) 155/78 (!) 144/62 129/83 (!) 141/73  Pulse: 72 66 73   Resp: 14 14 13    Temp:    98.6 F (37 C)  TempSrc:    Oral  SpO2: 100% 100% 100%   Weight:      Height:  Constitutional: NAD, calm, comfortable Eyes: PERRL, lids and conjunctivae normal ENMT: Mucous membranes are moist. Posterior pharynx clear of any exudate or lesions.Normal dentition.  Neck: normal, supple, no masses, no thyromegaly Respiratory: clear to auscultation bilaterally, no wheezing, no crackles. Normal respiratory effort. No accessory muscle use.  Cardiovascular: Regular rate and rhythm, no murmurs / rubs / gallops. No extremity edema. 2+ pedal pulses. No carotid bruits.  Abdomen: no tenderness, no masses palpated. No hepatosplenomegaly. Bowel sounds positive.  Musculoskeletal: no clubbing / cyanosis. No joint deformity upper and lower  extremities. Good ROM, no contractures. Normal muscle tone.  Skin: no rashes, lesions, ulcers. No induration Neurologic: CN 2-12 grossly intact. Sensation intact, DTR normal. Strength 5/5 in all 4.  Psychiatric: Oriented to self only.    Labs on Admission: I have personally reviewed following labs and imaging studies  CBC: Recent Labs  Lab 05/18/20 1345  WBC 6.4  NEUTROABS 4.7  HGB 14.7  HCT 45.0  MCV 93.2  PLT 177   Basic Metabolic Panel: Recent Labs  Lab 05/18/20 1345  NA 157*  K 4.1  CL 120*  CO2 17*  GLUCOSE 121*  BUN 192*  CREATININE 12.32*  CALCIUM 9.0   GFR: Estimated Creatinine Clearance: 3.8 mL/min (A) (by C-G formula based on SCr of 12.32 mg/dL (H)). Liver Function Tests: Recent Labs  Lab 05/18/20 1345  AST 22  ALT 28  ALKPHOS 65  BILITOT 0.8  PROT 7.3  ALBUMIN 4.0   No results for input(s): LIPASE, AMYLASE in the last 168 hours. No results for input(s): AMMONIA in the last 168 hours. Coagulation Profile: No results for input(s): INR, PROTIME in the last 168 hours. Cardiac Enzymes: No results for input(s): CKTOTAL, CKMB, CKMBINDEX, TROPONINI in the last 168 hours. BNP (last 3 results) No results for input(s): PROBNP in the last 8760 hours. HbA1C: No results for input(s): HGBA1C in the last 72 hours. CBG: No results for input(s): GLUCAP in the last 168 hours. Lipid Profile: No results for input(s): CHOL, HDL, LDLCALC, TRIG, CHOLHDL, LDLDIRECT in the last 72 hours. Thyroid Function Tests: No results for input(s): TSH, T4TOTAL, FREET4, T3FREE, THYROIDAB in the last 72 hours. Anemia Panel: No results for input(s): VITAMINB12, FOLATE, FERRITIN, TIBC, IRON, RETICCTPCT in the last 72 hours. Urine analysis:    Component Value Date/Time   COLORURINE YELLOW 05/18/2020 1541   APPEARANCEUR CLOUDY (A) 05/18/2020 1541   LABSPEC 1.020 05/18/2020 1541   PHURINE 5.5 05/18/2020 1541   GLUCOSEU NEGATIVE 05/18/2020 1541   HGBUR MODERATE (A) 05/18/2020 1541    BILIRUBINUR NEGATIVE 05/18/2020 1541   KETONESUR NEGATIVE 05/18/2020 1541   PROTEINUR 30 (A) 05/18/2020 1541   NITRITE NEGATIVE 05/18/2020 1541   LEUKOCYTESUR SMALL (A) 05/18/2020 1541    Radiological Exams on Admission: CT Head Wo Contrast  Result Date: 05/18/2020 CLINICAL DATA:  Mental status changes. EXAM: CT HEAD WITHOUT CONTRAST TECHNIQUE: Contiguous axial images were obtained from the base of the skull through the vertex without intravenous contrast. COMPARISON:  03/13/2016 FINDINGS: Brain: There is atrophy and chronic small vessel disease changes. No acute intracranial abnormality. Specifically, no hemorrhage, hydrocephalus, mass lesion, acute infarction, or significant intracranial injury. Vascular: No hyperdense vessel or unexpected calcification. Skull: No acute calvarial abnormality. Sinuses/Orbits: Visualized paranasal sinuses and mastoids clear. Orbital soft tissues unremarkable. Other: None IMPRESSION: Atrophy, chronic microvascular disease. No acute intracranial abnormality. Electronically Signed   By: Charlett NoseKevin  Dover M.D.   On: 05/18/2020 15:46   DG Chest Portable 1 View  Result  Date: 05/18/2020 CLINICAL DATA:  Altered mental status EXAM: PORTABLE CHEST 1 VIEW COMPARISON:  07/22/2018, CT 11/22/2019 FINDINGS: Emphysematous disease. No focal opacity or pleural effusion. Normal cardiomediastinal silhouette. No pneumothorax. IMPRESSION: No active disease. Emphysematous disease. Electronically Signed   By: Jasmine Pang M.D.   On: 05/18/2020 16:03    EKG: Independently reviewed.  Assessment/Plan Principal Problem:   Acute renal failure (ARF) (HCC) Active Problems:   Acute metabolic encephalopathy   Dementia without behavioral disturbance (HCC)   Acute urinary retention   Hypernatremia   Gross hematuria    1. AKF - 1. Appears to be primarily driven by obstructive uropathy, possibly with superimposed dehydration / ATN. 2. Cipro induced renal injury is also a consideration but  less likely given the clinical presentation. 3. Foley placed, has at least 3L UOP thus far. 4. IVF: LR at 100 currently 5. Repeating BMP now 6. Then BMP Q6H 2. Hypernatremia - 1. See above 3. Gross hematuria - 1. Due most likely to massive UOP, has developed hemorrhagic cystitis ("Decompression hematuria"). 2. Note initial UA at [facility] was not grossly bloody. 3. Let RN know to watch for decreased UOP possibly indicating clot blocking catheter in which case we would need to start CBI, but pt continues to have good UOP for the moment. 4. Acute metabolic encephalopathy - 1. Due to uremia and hypernatremia on top of chronic dementia 5. Dementia - 1. Chronic, baseline, stable 2. Med rec still pending at this time  DVT prophylaxis: SCDs - due to gross hematuria Code Status: Assumed full code for the moment (need to clarify with family tomorrow, dont have documentation to the contrary) Family Communication: No family in room Disposition Plan: Home after renal recovery Consults called: EDP spoke with nephrology Admission status: Admit to inpatient  Severity of Illness: The appropriate patient status for this patient is INPATIENT. Inpatient status is judged to be reasonable and necessary in order to provide the required intensity of service to ensure the patient's safety. The patient's presenting symptoms, physical exam findings, and initial radiographic and laboratory data in the context of their chronic comorbidities is felt to place them at high risk for further clinical deterioration. Furthermore, it is not anticipated that the patient will be medically stable for discharge from the hospital within 2 midnights of admission. The following factors support the patient status of inpatient.   IP status due to acute kidney failure with BUN 192 and creat of 12.  * I certify that at the point of admission it is my clinical judgment that the patient will require inpatient hospital care spanning beyond 2  midnights from the point of admission due to high intensity of service, high risk for further deterioration and high frequency of surveillance required.*    Pearce Littlefield M. DO [facility]  How to contact the [facility] Attending or Consulting provider 7A - 7P or covering provider during after hours 7P -7A, for this patient?  1. Check the care team in [facility] and look for a) attending/consulting [facility] provider listed and b) the [facility] team listed 2. Log into [email or web address]  Amion Physician Scheduling and messaging for groups and whole hospitals  On call and physician scheduling software for group practices, residents, hospitalists and other medical providers for call, clinic, rotation and shift schedules. OnCall [employer] is a hospital-wide system for scheduling doctors and paging doctors on call. EasyPlot is for scientific plotting and data analysis.  [email or web address]  and use [facility]'s universal password to access. If you do  not have the password, please contact the hospital operator.  3. Locate the [facility] provider you are looking for under [facility] and page to a number that you can be directly reached. 4. If you still have difficulty reaching the provider, please page the [facility] (Director on Call) for the Hospitalists listed on amion for assistance.  05/19/2020, 2:06 AM

## 2020-05-19 NOTE — Consult Note (Signed)
Nephrology Consult  [facility]  Requesting provider: Maretta Bees, MD   Assessment/Recommendations:   AKI secondary to obstructive uropathy. Non-oliguric. Improving:  -baseline seems to be around 1.3 (as of 07/2018) -peak Cr 12.3, now 9.8. s/p foley placement, robust urine output thereafter -check renal u/s -agree with increase in mIVF given now that he is having post-obstructive diuresis -I do not believe he is an ideal dialysis candidate (especially long-term) given advanced age, progressively worsening dementia with progressively worsening functionality. See consult HPI, had an extensive discussion about this with son and he is in full agreement about this -consider GU eval, can be done as an outpatient -Continue to monitor daily Cr, Dose meds for GFR<15 -Monitor Daily I/Os, Daily weight  -Maintain MAP>65 for optimal renal perfusion.  -Agree with holding ACE-I, avoid further nephrotoxins including NSAIDS, Morphine.  Unless absolutely necessary, avoid CT with contrast and/or MRI with gadolinium.     Hypernatremia -Free water deficit 1.7L -1/2 NS, agree with increasing rate -ensure access to free water  Anion Gap Metabolic acidosis -secondary to AKI -start NaHCO3 1300mg  TID for now (as kidney function improves, may be able to get off of this)  Acute Metabolic Encephalopathy -does have baseline dementia. Hopeful that as his azotemia improves, his mental status should also improve towards baseline  Advanced Dementia -recommend palliative care here. Discussed their role with son at the bedside who actually would like to speak to them since he has been in the process of filling out POA papers. He would also like to address goals of care  Left sided weakness -CTH negative, possible MRI? Will defer to primary service  Hypertension: -not on anti-HTN's, monitor for now. BP acceptable  H/O BPH -restart flomax   Recommendations conveyed to primary service.     [facility] 05/19/2020 8:19 AM   _____________________________________________________________________________________   History of Present Illness: Clarence Padilla is a/an 86 y.o. male with a past medical history of COPD, dementia, hypertension, BPH, HLD who presents to [facility] (transferred from [facility]) with altered mental status and generalized weakness.  There is a suspicion of UTI therefore was started on Cipro this past Tuesday.  Despite this has had progressively worsening symptoms.  Of note, he is being followed by palliative care as an outpatient. Was directed to the ER, found to have sodium 157 creatinine 12.3 and BUN of 192.  Was transferred here for concerns that he may need dialysis.  He was found to have obstructive uropathy therefore a Foley was placed.  Thereafter, he had 2 L of urine output.  Did also have gross hematuria post Foley which eventually improved.  Son at bedside this AM (who patient lives with), had an extensive discussion with him in regards to patient's baseline status. Apparently, his dementia has been progressively worsening (quickly) for the last 3-4 months. Used to be very functional however that has also been worsening. When I inquired about palliative care, son had never heard of palliative care. Apparently, patient has been having left sided weakness since being in the ER and has been favoring his right side. CTH negative for any acute pathologies. Son also reports that patient has been having choking episodes with eating lately and therefore has been eating very little at home.  We discussed prospects of dialysis and I voiced my concerns for long-term dialysis candidacy and son agrees that dialysis would not be in his best interest especially with the big picture of advanced dementia which is progressing.  ROS unobtainable given mentation.  Medications:  Current Facility-Administered Medications  Medication Dose Route Frequency  Provider Last Rate Last Admin  . 0.45 % sodium chloride infusion   Intravenous Continuous Ghimire, Werner Lean, MD      . acetaminophen (TYLENOL) tablet 650 mg  650 mg Oral Q6H PRN Hillary Bow, DO       Or  . acetaminophen (TYLENOL) suppository 650 mg  650 mg Rectal Q6H PRN Hillary Bow, DO      . cefTRIAXone (ROCEPHIN) 1 g in sodium chloride 0.9 % 100 mL IVPB  1 g Intravenous Q24H Ghimire, Werner Lean, MD      . ondansetron [facility]) tablet 4 mg  4 mg Oral Q6H PRN Hillary Bow, DO       Or  . ondansetron [facility]) injection 4 mg  4 mg Intravenous Q6H PRN Hillary Bow, DO         ALLERGIES Lovastatin  MEDICAL HISTORY Past Medical History:  Diagnosis Date  . BPH (benign prostatic hyperplasia)   . COPD (chronic obstructive pulmonary disease) (HCC)   . Dementia (HCC)   . Hyperlipidemia 07/15/12  . Hypertension      SOCIAL HISTORY Social History   Socioeconomic History  . Marital status: Widowed    Spouse name: Not on file  . Number of children: Not on file  . Years of education: Not on file  . Highest education level: Not on file  Occupational History  . Not on file  Tobacco Use  . Smoking status: Current Every Day Smoker    Packs/day: 1.00    Types: Cigarettes  . Smokeless tobacco: Never Used  Substance and Sexual Activity  . Alcohol use: No  . Drug use: Not on file  . Sexual activity: Not Currently  Other Topics Concern  . Not on file  Social History Narrative  . Not on file   Social Determinants of Health   Financial Resource Strain: Not on file  Food Insecurity: Not on file  Transportation Needs: Not on file  Physical Activity: Not on file  Stress: Not on file  Social Connections: Not on file  Intimate Partner Violence: Not on file     FAMILY HISTORY No family history on file.    Review of Systems: 12 systems reviewed Otherwise as per HPI, all other systems reviewed and negative  Physical Exam: Vitals:   05/18/20 2205 05/19/20 0603   BP: (!) 141/73 103/61  Pulse:  85  Resp:  16  Temp: 98.6 F (37 C) 98.7 F (37.1 C)  SpO2:  99%   No intake/output data recorded.  Intake/Output Summary (Last 24 hours) at 05/19/2020 0923 Last data filed at 05/19/2020 0125 Gross per 24 hour  Intake 2000 ml  Output 3050 ml  Net -1050 ml   General: Chronically ill-appearing, NAD HEENT: anicteric sclera, oropharynx clear without lesions, dry mucosal membranes, temporal wasting CV: regular rate, normal rhythm, no murmurs, no gallops, no rubs Lungs: clear to auscultation bilaterally, normal work of breathing Abd: soft, non-tender, non-distended Skin: no visible lesions or rashes Psych: alert, engaged, appropriate mood and affect Musculoskeletal: No edema, no obvious deformities Neuro: Lethargic, responds to tactile stimuli, not following commands GU: +Foley with blood tinged urine in bag  Test Results Reviewed Lab Results  Component Value Date   NA 158 (H) 05/19/2020   K 4.2 05/19/2020   CL 123 (H) 05/19/2020   CO2 16 (L) 05/19/2020   BUN 163 (H) 05/19/2020  CREATININE 9.79 (H) 05/19/2020   GLU 84 02/17/2012   CALCIUM 8.2 (L) 05/19/2020   ALBUMIN 4.0 05/18/2020     I have reviewed all relevant outside healthcare records related to the patient's kidney injury.

## 2020-05-19 NOTE — Evaluation (Signed)
Clinical/Bedside Swallow Evaluation Patient Details  Name: Clarence Padilla MRN: [id number] Date of Birth: 09-06-1933  Today's Date: 05/19/2020 Time: SLP Start Time (ACUTE ONLY): 1236 SLP Stop Time (ACUTE ONLY): 1255 SLP Time Calculation (min) (ACUTE ONLY): 19 min  Past Medical History:  Past Medical History:  Diagnosis Date  . BPH (benign prostatic hyperplasia)   . COPD (chronic obstructive pulmonary disease) (HCC)   . Dementia (HCC)   . Hyperlipidemia 07/15/12  . Hypertension    Past Surgical History:  Past Surgical History:  Procedure Laterality Date  . CATARACT EXTRACTION W/PHACO Left 05/12/2013   Procedure: CATARACT EXTRACTION PHACO AND INTRAOCULAR LENS PLACEMENT (IOC);  Surgeon: Shade Flood, MD;  Location: [facility] OR;  Service: Ophthalmology;  Laterality: Left;   HPI:  Pt is an 86 y.o. male with medical history significant for COPD, dementia, HTN who was admitted with AMS and generalized weakness.  Pt is seeing palliative care as outpt at baseline though still able to walk and interact as of a week prior to admission. CXR was negative for acute changes. Pt found to have acute renal failure due to obstructive uropathy   Assessment / Plan / Recommendation Clinical Impression  Pt was seen for bedside swallow evaluation. Verbal output was limited and unintelligible. He exhibited difficulty following commands and a complete oral mechanism exam was not conducted for this reason. Pt was edentulous and dentures were not found in the room. He tolerated all solids and liquids without signs or symptoms of aspiration. Mastication was absent with regular textures despite cueing, and prolonged with dysphagia 3 solids. Mastication was functional with dysphagia 2 solids and no significant oral residue was noted. A dysphagia 2 diet with thin liquids is recommended at this time and SLP will follow pt. SLP Visit Diagnosis: Dysphagia, unspecified (R13.10)    Aspiration Risk  Mild aspiration risk    Diet  Recommendation Dysphagia 2 (Fine chop);Thin liquid   Liquid Administration via: Cup;Straw Medication Administration: Crushed with puree Supervision: Staff to assist with self feeding Compensations: Slow rate;Small sips/bites;Minimize environmental distractions Postural Changes: Seated upright at 90 degrees;Remain upright for at least 30 minutes after po intake    Other  Recommendations Oral Care Recommendations: Oral care BID   Follow up Recommendations Other (comment) (TBD)      Frequency and Duration min 2x/week  2 weeks       Prognosis Prognosis for Safe Diet Advancement: Good Barriers to Reach Goals: Cognitive deficits      Swallow Study   General Date of Onset: 05/18/20 HPI: Pt is an 86 y.o. male with medical history significant for COPD, dementia, HTN who was admitted with AMS and generalized weakness.  Pt is seeing palliative care as outpt at baseline though still able to walk and interact as of a week prior to admission. CXR was negative for acute changes. Pt found to have acute renal failure due to obstructive uropathy Type of Study: Bedside Swallow Evaluation Previous Swallow Assessment: None Diet Prior to this Study: Regular;Thin liquids Temperature Spikes Noted: No Respiratory Status: Room air History of Recent Intubation: No Behavior/Cognition: Alert;Confused;Doesn't follow directions Oral Cavity Assessment: Within Functional Limits Oral Care Completed by SLP: No Oral Cavity - Dentition: Edentulous Vision: Functional for self-feeding Self-Feeding Abilities: Able to feed self Patient Positioning: Upright in bed;Postural control adequate for testing Baseline Vocal Quality: Normal Volitional Cough: Cognitively unable to elicit Volitional Swallow: Unable to elicit    Oral/Motor/Sensory Function Overall Oral Motor/Sensory Function: Within functional limits   Ice Chips Ice  chips: Within functional limits Presentation: Spoon   Thin Liquid Thin Liquid: Within  functional limits Presentation: Spoon    Nectar Thick Nectar Thick Liquid: Not tested   Honey Thick Honey Thick Liquid: Not tested   Puree Puree: Within functional limits Presentation: Spoon   Solid     Solid: Impaired Oral Phase Impairments: Poor awareness of bolus;Impaired mastication     Haik Mahoney I. Vear Clock, MS, CCC-SLP Acute Rehabilitation Services Office number [phone] Pager [phone]  Scheryl Marten 05/19/2020,2:08 PM

## 2020-05-19 NOTE — Progress Notes (Signed)
Update: pt continues to make urine, urine now becoming less bloody and more clear per RN.  Repeat BMP reviewed.  Will switch IVF from LR to half NS to try and get sodium down.  BMPs ordered Q4H.  Checking lactic acid given the AG of 20, but I suspect AG of 20 is just due to uremia.

## 2020-05-20 ENCOUNTER — Inpatient Hospital Stay (HOSPITAL_COMMUNITY): Payer: Medicare HMO

## 2020-05-20 DIAGNOSIS — R310 Gross hematuria: Secondary | ICD-10-CM

## 2020-05-20 DIAGNOSIS — G9341 Metabolic encephalopathy: Secondary | ICD-10-CM

## 2020-05-20 DIAGNOSIS — Z66 Do not resuscitate: Secondary | ICD-10-CM

## 2020-05-20 DIAGNOSIS — Z7189 Other specified counseling: Secondary | ICD-10-CM

## 2020-05-20 DIAGNOSIS — F0390 Unspecified dementia without behavioral disturbance: Secondary | ICD-10-CM

## 2020-05-20 DIAGNOSIS — E870 Hyperosmolality and hypernatremia: Secondary | ICD-10-CM

## 2020-05-20 DIAGNOSIS — Z515 Encounter for palliative care: Secondary | ICD-10-CM

## 2020-05-20 DIAGNOSIS — N179 Acute kidney failure, unspecified: Principal | ICD-10-CM

## 2020-05-20 LAB — BASIC METABOLIC PANEL
Anion gap: 17 — ABNORMAL HIGH (ref 5–15)
Anion gap: 17 — ABNORMAL HIGH (ref 5–15)
BUN: 142 mg/dL — ABNORMAL HIGH (ref 8–23)
BUN: 124 mg/dL — ABNORMAL HIGH (ref 8–23)
CO2: 19 mmol/L — ABNORMAL LOW (ref 22–32)
CO2: 17 mmol/L — ABNORMAL LOW (ref 22–32)
Calcium: 8.4 mg/dL — ABNORMAL LOW (ref 8.9–10.3)
Calcium: 8.2 mg/dL — ABNORMAL LOW (ref 8.9–10.3)
Chloride: 124 mmol/L — ABNORMAL HIGH (ref 98–111)
Chloride: 123 mmol/L — ABNORMAL HIGH (ref 98–111)
Creatinine, Ser: 7.55 mg/dL — ABNORMAL HIGH (ref 0.61–1.24)
Creatinine, Ser: 6.07 mg/dL — ABNORMAL HIGH (ref 0.61–1.24)
GFR, Estimated: 7 mL/min — ABNORMAL LOW (ref >60)
GFR, Estimated: 9 mL/min — ABNORMAL LOW (ref >60)
Glucose, Bld: 125 mg/dL — ABNORMAL HIGH (ref 70–99)
Glucose, Bld: 159 mg/dL — ABNORMAL HIGH (ref 70–99)
Potassium: 3.7 mmol/L (ref 3.5–5.1)
Potassium: 3.2 mmol/L — ABNORMAL LOW (ref 3.5–5.1)
Sodium: 160 mmol/L — ABNORMAL HIGH (ref 135–145)
Sodium: 157 mmol/L — ABNORMAL HIGH (ref 135–145)

## 2020-05-20 LAB — COMPREHENSIVE METABOLIC PANEL
ALT: 23 U/L (ref 0–44)
AST: 22 U/L (ref 15–41)
Albumin: 3 g/dL — ABNORMAL LOW (ref 3.5–5.0)
Alkaline Phosphatase: 51 U/L (ref 38–126)
Anion gap: 17 — ABNORMAL HIGH (ref 5–15)
BUN: 147 mg/dL — ABNORMAL HIGH (ref 8–23)
CO2: 19 mmol/L — ABNORMAL LOW (ref 22–32)
Calcium: 8.6 mg/dL — ABNORMAL LOW (ref 8.9–10.3)
Chloride: 126 mmol/L — ABNORMAL HIGH (ref 98–111)
Creatinine, Ser: 8.08 mg/dL — ABNORMAL HIGH (ref 0.61–1.24)
GFR, Estimated: 6 mL/min — ABNORMAL LOW (ref >60)
Glucose, Bld: 117 mg/dL — ABNORMAL HIGH (ref 70–99)
Potassium: 3.9 mmol/L (ref 3.5–5.1)
Sodium: 162 mmol/L (ref 135–145)
Total Bilirubin: 0.6 mg/dL (ref 0.3–1.2)
Total Protein: 5.8 g/dL — ABNORMAL LOW (ref 6.5–8.1)

## 2020-05-20 LAB — CBC
HCT: 40.2 % (ref 39.0–52.0)
Hemoglobin: 13.5 g/dL (ref 13.0–17.0)
MCH: 30.5 pg (ref 26.0–34.0)
MCHC: 33.6 g/dL (ref 30.0–36.0)
MCV: 90.7 fL (ref 80.0–100.0)
Platelets: 121 10*3/uL — ABNORMAL LOW (ref 150–400)
RBC: 4.43 MIL/uL (ref 4.22–5.81)
RDW: 14.5 % (ref 11.5–15.5)
WBC: 6.1 10*3/uL (ref 4.0–10.5)
nRBC: 0 % (ref 0.0–0.2)

## 2020-05-20 MED ORDER — BISACODYL 10 MG RE SUPP
10.0000 mg | Freq: Once | RECTAL | Status: AC
  Administered 2020-05-20: 10 mg via RECTAL
  Filled 2020-05-20: qty 1

## 2020-05-20 MED ORDER — DEXTROSE 5 % IV SOLN: INTRAVENOUS | Status: DC

## 2020-05-20 NOTE — Progress Notes (Signed)
Patients sodium level was noted critical high, On call MD notified. New orders obtained, see mar.

## 2020-05-20 NOTE — Plan of Care (Signed)

## 2020-05-20 NOTE — Progress Notes (Signed)
[facility] Progress Note    Assessment/ Plan:   AKI secondary to obstructive uropathy. Non-oliguric. Improving:  -baseline seems to be around 1.3 (as of 07/2018) -peak Cr 12.3, kidney function continues to improve. s/p foley placement, robust urine output thereafter -ultrasound as below -agree with increase in mIVF given now that he is having post-obstructive diuresis -I do not believe he is an ideal dialysis candidate (especially long-term) given advanced age, progressively worsening dementia with progressively worsening functionality. See consult HPI, had an extensive discussion about this with son on day of consultation and he is in full agreement about this -consider GU eval, can be done as an outpatient. Will likely need long-term foley -Continue to monitor daily Cr, Dose meds for GFR<15 -Monitor Daily I/Os, Daily weight  -Maintain MAP>65 for optimal renal perfusion.  -Agree with holding ACE-I, avoid further nephrotoxins including NSAIDS, Morphine.  Unless absolutely necessary, avoid CT with contrast and/or MRI with gadolinium.  Obstructive uropathy  -Renal ultrasound from 12/10 reviewed.    Bilateral hydronephrosis left greater than right.  Possible bilateral distal obstruction which warrants further evaluation with a CT scan of abdomen pelvis (in his case will be without contrast).  There is abnormal appearance of the bladder which is diffusely thick.  Wall also appears irregular and contains multifocal punctate echogenic foci.  Marked prostatomegaly.  Echogenic renal parenchyma bilaterally. For seeing long-term Foley.  CT scan as above  Hypernatremia -Free water deficit now 1.9L - agree with D5 100 cc an hour.  Twice daily labs.  If sodium level unchanged or rising then would favor a bolus with half-normal saline (1 L).  Ensure that patient has access to free water  Anion Gap Metabolic acidosis, improving -secondary to AKI -Expecting improvement as kidney  function improves  Acute Metabolic Encephalopathy -does have baseline dementia. Hopeful that as his azotemia improves, his mental status should also improve towards baseline  Advanced Dementia -recommend palliative care here. Discussed their role with son at the bedside who actually would like to speak to them since he has been in the process of filling out POA papers. He would also like to address goals of care  Left sided weakness -CTH negative, possible MRI? Will defer to primary service  Hypertension: -not on anti-HTN's, monitor for now. BP acceptable  H/O BPH -restart flomax   Recommendations conveyed to primary service.   Subjective:   Worsening hyponatremia overnight which warranted D5W at 100 cc an hour.  Sodium improved to 160 (from 162).  Patient slightly more awake today and does endorse thirst.  Urine output 3.4 L   Objective:   BP 138/73   Pulse 74   Temp (!) 97.5 F (36.4 C) (Axillary)   Resp 18   Ht 6\' 5"  (1.956 m)   Wt 60.3 kg   SpO2 100%   BMI 15.77 kg/m   Intake/Output Summary (Last 24 hours) at 05/20/2020 1022 Last data filed at 05/20/2020 0500 Gross per 24 hour  Intake 2602.41 ml  Output 3400 ml  Net -797.59 ml   Weight change:   Physical Exam: Gen: Chronically ill-appearing, NAD HEENT: Dry mucosal membranes, temporal wasting CVS: S1-S2, RRR, no M/R/G Resp: CTA BL, no W/R/R/C, unlabored Abd: Soft, nontender, nondistended Skin: Poor skin turgor Ext: No edema Neuro: Opens eyes and responds to vocal stimuli, speech not clear  Imaging: CT Head Wo Contrast  Result Date: 05/18/2020 CLINICAL DATA:  Mental status changes. EXAM: CT HEAD WITHOUT CONTRAST TECHNIQUE: Contiguous axial images were obtained from  the base of the skull through the vertex without intravenous contrast. COMPARISON:  03/13/2016 FINDINGS: Brain: There is atrophy and chronic small vessel disease changes. No acute intracranial abnormality. Specifically, no hemorrhage,  hydrocephalus, mass lesion, acute infarction, or significant intracranial injury. Vascular: No hyperdense vessel or unexpected calcification. Skull: No acute calvarial abnormality. Sinuses/Orbits: Visualized paranasal sinuses and mastoids clear. Orbital soft tissues unremarkable. Other: None IMPRESSION: Atrophy, chronic microvascular disease. No acute intracranial abnormality. Electronically Signed   By: Charlett Nose M.D.   On: 05/18/2020 15:46   US RENAL  Result Date: 05/19/2020 CLINICAL DATA:  Acute kidney injury EXAM: RENAL / URINARY TRACT ULTRASOUND COMPLETE COMPARISON:  None. FINDINGS: Right Kidney: Renal measurements: 10.5 x 7.3 x 5.2 cm = volume: 209 mL. Mild to moderate hydronephrosis. The renal parenchyma is echogenic. Left Kidney: Renal measurements: 11.9 x 6.5 x 6.7 cm = volume: 271 mL. Moderate to severe hydronephrosis. The renal parenchyma is echogenic in appearance. An echogenic structure with posterior acoustic shadowing measuring up to 0.8 cm is identified in the lower pole collecting system. There is positive color comet tail artifact. This is consistent with nephrolithiasis. Circumscribed anechoic lesion with minimal internal debris present in the interpolar region measuring 1.7 x 1.4 by 1.6 cm. Bladder: A Foley catheter is present within the bladder. The bladder wall appears diffusely thickened with multifocal punctate echogenic foci. Additionally, the prostate gland is markedly enlarged. Other: None. IMPRESSION: 1. Bilateral hydronephrosis, mild to moderate on the right and moderate to severe on the left. This suggests bilateral distal obstruction. Recommend further evaluation with CT scan of the abdomen and pelvis. 2. Abnormal appearance of the bladder which is diffusely thick walled. The wall also appears irregular and contains multifocal punctate echogenic foci. Differential considerations include chronic trabeculation related to bladder outlet obstruction, chronic inflammatory cystitis,  and bladder malignancy. Cystoscopy could further evaluate if clinically warranted. 3. Marked prostatomegaly. 4. Echogenic renal parenchyma bilaterally consistent with underlying medical renal disease. Electronically Signed   By: Malachy Moan M.D.   On: 05/19/2020 10:41   DG Chest Portable 1 View  Result Date: 05/18/2020 CLINICAL DATA:  Altered mental status EXAM: PORTABLE CHEST 1 VIEW COMPARISON:  07/22/2018, CT 11/22/2019 FINDINGS: Emphysematous disease. No focal opacity or pleural effusion. Normal cardiomediastinal silhouette. No pneumothorax. IMPRESSION: No active disease. Emphysematous disease. Electronically Signed   By: Jasmine Pang M.D.   On: 05/18/2020 16:03    Labs: BMET Recent Labs  Lab 05/18/20 1345 05/19/20 0159 05/19/20 0607 05/19/20 1047 05/19/20 1705 05/20/20 0153 05/20/20 0502  NA 157* 158* 158* 159* 158* 162* 160*  K 4.1 3.7 4.2 3.7 3.5 3.9 3.7  CL 120* 121* 123* 124* 123* 126* 124*  CO2 17* 17* 16* 17* 17* 19* 19*  GLUCOSE 121* 119* 123* 156* 135* 117* 125*  BUN 192* 168* 163* 161* 154* 147* 142*  CREATININE 12.32* 10.68* 9.79* 9.73* 9.10* 8.08* 7.55*  CALCIUM 9.0 8.7* 8.2* 8.4* 8.2* 8.6* 8.4*   CBC Recent Labs  Lab 05/18/20 1345 05/19/20 0607 05/20/20 0153  WBC 6.4 4.6 6.1  NEUTROABS 4.7  --   --   HGB 14.7 12.2* 13.5  HCT 45.0 38.3* 40.2  MCV 93.2 93.6 90.7  PLT 177 140* 121*    Medications:    . donepezil  10 mg Oral QHS  . finasteride  5 mg Oral Daily  . memantine  10 mg Oral Daily  . montelukast  10 mg Oral Daily  . rosuvastatin  5 mg Oral Daily  .  tamsulosin  0.8 mg Oral Daily      Anthony Sar, MD [facility] 05/20/2020, 10:22 AM

## 2020-05-20 NOTE — Consult Note (Signed)
Palliative Medicine Inpatient Consult Note  Reason for consult:  Goals of Care  HPI:  Per intake H&P --> Patient is a 86 y.o. male with dementia, HTN, BPH, HLD-who presented with altered mental status-upon further evaluation-was found to have acute renal failure due to obstructive uropathy.   Palliative care asked to get involved to aid in goals of care conversation  Clinical Assessment/Goals of Care: I have reviewed medical records including EPIC notes, labs and imaging, received report from bedside RN, assessed the patient who was lying in bed in no distress.    I met with Alysia Penna. [employer].  to further discuss diagnosis prognosis, [place], EOL wishes, disposition and options.   I introduced Palliative Medicine as specialized medical care for people living with serious illness. It focuses on providing relief from the symptoms and stress of a serious illness. The goal is to improve quality of life for both the patient and the family.  Hooper shares with me that his father is from [place], [place].  He had been married twice his most recent wife passed away 4-1/2 years ago.  Devean has 2 sisters and 4 brothers.  He had 3 children.  He was always an active man and worked for the city of [place] where he retired from.  He is a faithful man though is nondenominational.  Miqueas Sr. lives with his son in a townhome.  Prior to about 3 weeks ago Talyn was fully mobile and able to mobilize up and down the stairs without any assistance.  He was able to dress himself and would often eat with family.  He thrived with his socialization he had in his home.  Three weeks ago after a family trip to the [place] started to decline per his son he did not know why but he was much more weakened and less able to participate in activities at home.  He had an episode where the left part of his body seem to get flaccid.  Valinda Party. shares that there had been nothing identified on CT scans we reviewed that  an MRI had not been done though this was not considered to be prudent at this point given exam findings by primary medical team.  We also reviewed that Fabyan is pretty notably constipated and has several kidney stones which could be contributing to his symptoms.  Dorse Locy is not a candidate for urology procedures nor for hemodialysis.  We talked about his ongoing failure to thrive and that a feeding tube in his situation would not prove to be beneficial if anything it would prevent the inevitable.  A detailed discussion was had today regarding advanced directives - none have been filed though Zoe shares that he has been caring for his father for the past two years as his primary caregiver.    Concepts specific to code status, artifical feeding and hydration, continued IV antibiotics and rehospitalization was had.  I completed a MOST form today. The patient and family outlined their wishes for the following treatment decisions:  Cardiopulmonary Resuscitation: Do Not Attempt Resuscitation (DNR/No CPR)  Medical Interventions: Limited Additional Interventions: Use medical treatment, IV fluids and cardiac monitoring as indicated, DO NOT USE intubation or mechanical ventilation. May consider use of less invasive airway support such as BiPAP or CPAP. Also provide comfort measures. Transfer to the hospital if indicated. Avoid intensive care.   Antibiotics: Antibiotics if indicated  IV Fluids: IV fluids for a defined trial period  Feeding Tube: No feeding  tube   The difference between a aggressive medical intervention path  and a palliative comfort care path for this patient at this time was had.  I described hospice as a service for patients for have a life expectancy of < 6 months. It preserves dignity and quality at the end phases of life. The focus changes from curative to symptom relief.   I shared with Jeneen Rinks Junior that hospice would be an appropriate consideration for his father given his significant  decline and poor likelihood for significant improvement.  Jeneen Rinks and I decided to wait to see how he does throughout the weekend before making any final decisions.  In the meanwhile we determined that we would get him more information on home hospice care.  Discussed the importance of continued conversation with family and their  medical providers regarding overall plan of care and treatment options, ensuring decisions are within the context of the patients values and GOCs.  Decision Maker: Quintyn Dombek (son)  - [phone]  SUMMARY OF RECOMMENDATIONS   DNAR/DNI  MOST Completed, paper copy placed onto the chart electric copy can be found in [facility]  DNR Form Completed, paper copy placed onto the chart electric copy can be found in [facility]  Continue current measures for the time being, if not improvements over the weekend would favor transitioning home with hospice care  TOC - Consult to hospice for patients son to obtain more information  Ongoing PMT support  Code Status/Advance Care Planning: DNAR/DNI  Palliative Prophylaxis:   Oral Care, mobility, Pain  Additional Recommendations (Limitations, Scope, Preferences):  Continue with current scope of care   Psycho-social/Spiritual:   Desire for further Chaplaincy support: No  Additional Recommendations: Education on end of life and hospice care   Prognosis: Poor in the setting of profound FTT  Discharge Planning: Unclear though most likely will discharge to home with hospice  Vitals:   05/19/20 2102 05/20/20 0443  BP: 108/77 138/73  Pulse: 83 74  Resp: 16 18  Temp: 98.9 F (37.2 C) (!) 97.5 F (36.4 C)  SpO2: 96% 100%    Intake/Output Summary (Last 24 hours) at 05/20/2020 1407 Last data filed at 05/20/2020 1255 Gross per 24 hour  Intake 3002.44 ml  Output 2700 ml  Net 302.44 ml   Last Weight  Most recent update: 05/18/2020  1:20 PM   Weight  60.3 kg (133 lb)           Gen:  [place] cachectic elderly AA  M HEENT: moist mucous membranes CV: Regular rate and rhythm  PULM: clear to auscultation bilaterally  ABD: soft/nontender  EXT: No edema  Neuro: Alert responds to name - weak  PPS: 20%   This conversation/these recommendations were discussed with patient primary care team, Dr. Sloan Leiter  Time In: 1250 Time Out: 1400 Total Time: 70 Greater than 50%  of this time was spent counseling and coordinating care related to the above assessment and plan.  [place] Team Team Cell Phone: [phone] Please utilize secure chat with additional questions, if there is no response within 30 minutes please call the above phone number  Palliative Medicine Team providers are available by phone from 7am to 7pm daily and can be reached through the team cell phone.  Should this patient require assistance outside of these hours, please call the patient's attending physician.

## 2020-05-20 NOTE — Plan of Care (Signed)
  Problem: Education: Goal: Knowledge of General Education information will improve Description: Including pain rating scale, medication(s)/side effects and non-pharmacologic comfort measures Outcome: Progressing   Problem: Health Behavior/Discharge Planning: Goal: Ability to manage health-related needs will improve Outcome: Progressing   Problem: Clinical Measurements: Goal: Ability to maintain clinical measurements within normal limits will improve Outcome: Progressing Goal: Will remain free from infection Outcome: Progressing Goal: Diagnostic test results will improve Outcome: Progressing Goal: Respiratory complications will improve Outcome: Progressing Goal: Cardiovascular complication will be avoided Outcome: Progressing   Problem: Activity: Goal: Risk for activity intolerance will decrease Outcome: Progressing   Problem: Nutrition: Goal: Adequate nutrition will be maintained Outcome: Progressing   Problem: Coping: Goal: Level of anxiety will decrease Outcome: Progressing   Problem: Elimination: Goal: Will not experience complications related to bowel motility Outcome: Progressing Goal: Will not experience complications related to urinary retention Outcome: Progressing   Problem: Pain Managment: Goal: General experience of comfort will improve Outcome: Progressing   Problem: Safety: Goal: Ability to remain free from injury will improve Outcome: Progressing   Problem: Skin Integrity: Goal: Risk for impaired skin integrity will decrease Outcome: Progressing   Problem: Health Behavior/Discharge Planning: Goal: Ability to manage health-related needs will improve 05/20/2020 2252 by Elray Mcgregor, LPN Outcome: Progressing 05/20/2020 2252 by Elray Mcgregor, LPN Outcome: Progressing   Problem: Clinical Measurements: Goal: Ability to maintain clinical measurements within normal limits will improve 05/20/2020 2252 by Elray Mcgregor, LPN Outcome:  Progressing 05/20/2020 2252 by Elray Mcgregor, LPN Outcome: Progressing Goal: Will remain free from infection 05/20/2020 2252 by Elray Mcgregor, LPN Outcome: Progressing 05/20/2020 2252 by Elray Mcgregor, LPN Outcome: Progressing Goal: Diagnostic test results will improve 05/20/2020 2252 by Elray Mcgregor, LPN Outcome: Progressing 05/20/2020 2252 by Elray Mcgregor, LPN Outcome: Progressing Goal: Respiratory complications will improve 05/20/2020 2252 by Elray Mcgregor, LPN Outcome: Progressing 05/20/2020 2252 by Elray Mcgregor, LPN Outcome: Progressing Goal: Cardiovascular complication will be avoided 05/20/2020 2252 by Elray Mcgregor, LPN Outcome: Progressing 05/20/2020 2252 by Elray Mcgregor, LPN Outcome: Progressing   Problem: Activity: Goal: Risk for activity intolerance will decrease 05/20/2020 2252 by Elray Mcgregor, LPN Outcome: Progressing 05/20/2020 2252 by Elray Mcgregor, LPN Outcome: Progressing   Problem: Nutrition: Goal: Adequate nutrition will be maintained 05/20/2020 2252 by Elray Mcgregor, LPN Outcome: Progressing 05/20/2020 2252 by Elray Mcgregor, LPN Outcome: Progressing   Problem: Coping: Goal: Level of anxiety will decrease 05/20/2020 2252 by Elray Mcgregor, LPN Outcome: Progressing 05/20/2020 2252 by Elray Mcgregor, LPN Outcome: Progressing   Problem: Elimination: Goal: Will not experience complications related to bowel motility 05/20/2020 2252 by Elray Mcgregor, LPN Outcome: Progressing 05/20/2020 2252 by Elray Mcgregor, LPN Outcome: Progressing Goal: Will not experience complications related to urinary retention 05/20/2020 2252 by Elray Mcgregor, LPN Outcome: Progressing 05/20/2020 2252 by Elray Mcgregor, LPN Outcome: Progressing   Problem: Pain Managment: Goal: General experience of comfort will improve 05/20/2020 2252 by Elray Mcgregor,  LPN Outcome: Progressing 05/20/2020 2252 by Elray Mcgregor, LPN Outcome: Progressing   Problem: Safety: Goal: Ability to remain free from injury will improve 05/20/2020 2252 by Elray Mcgregor, LPN Outcome: Progressing 05/20/2020 2252 by Elray Mcgregor, LPN Outcome: Progressing   Problem: Skin Integrity: Goal: Risk for impaired skin integrity will decrease 05/20/2020 2252 by Elray Mcgregor, LPN Outcome: Progressing 05/20/2020 2252 by Elray Mcgregor, LPN Outcome: Progressing

## 2020-05-20 NOTE — Progress Notes (Signed)
   05/20/20 0925  Clinical Encounter Type  Visited With Other (Comment)  Visit Type Initial  Referral From Nurse  Consult/Referral To Chaplain  Nurse, Josh stated patient's family is at bedside requesting AD. Advised spiritual care notary is not available on weekends. Advised him to call back on Monday morning. This note was prepared by Deneen Harts, M.Div..  For questions please contact by phone [phone].

## 2020-05-20 NOTE — Progress Notes (Addendum)
PROGRESS NOTE        PATIENT DETAILS Name: Clarence Padilla Age: 86 y.o. Sex: male Date of Birth: [birth year] Admit Date: 05/18/2020 Admitting Physician Jonah Blue, MD HWT:UUEK, Lind Guest, MD  Brief Narrative: Patient is a 86 y.o. male with dementia, HTN, BPH, HLD-who presented with altered mental status-upon further evaluation-was found to have acute renal failure due to obstructive uropathy.  See below for further details  Significant events: 12/9>> presented to [facility] with confusion-found to have ARF due to obstructive uropathy  Significant studies: 12/9>> CT head: No acute intracranial abnormality. 12/9>> chest x-ray: No pneumonia  Antimicrobial therapy: Rocephin: 12/10>>  Microbiology data: None  Procedures : None  Consults: Nephrology  DVT Prophylaxis : SCDs Start: 05/19/20 0205   Subjective: Much more alert and awake compared to yesterday-son at bedside-following most of my commands.  Assessment/Plan: ARF due to obstructive uropathy: Foley catheter in place-significant urine output/postoperative diuresis-improving-both BUN/creatinine downtrending-continue IV fluids-on Flomax and finasteride.  Hematuria has resolved this morning.  Will need urology evaluation at some point.  Addendum-reviewed renal ultrasound-discussed with nephrology-we will go ahead and order a CT abdomen and pelvis-and will also place a palliative care evaluation to delineate further goals of care.  Doubt that patient is a candidate for any aggressive urological procedures.  Hematuria: Probably secondary to BPH-but could have UTI-hematuria has resolved-on Rocephin/finasteride.    Acute metabolic encephalopathy: Secondary to uremia-should improve with improvement in renal function.  Hypernatremia: Secondary to poor oral intake-switched to D5W last night by night coverage-discussed with Dr. Inocente Salles IVF-and repeat labs this evening.  Dementia: Appears to be  advanced-continue Aricept/Namenda-expect some amount of delirium during hospital stay  BPH: On Flomax/finasteride-as noted above-we will need urology evaluation at some point.  ?  Left-sided weakness: Patient's son claims that on day of presentation-he noted left-sided weakness-overall difficult exam-but do not think patient has any significant left-sided weakness on my exam-could have had some uremic encephalopathy/left-sided weakness-CT head negative.  Given lingering confusion-do not think patient will be able to lie still for MRI-since low suspicion for CVA-do not think that MRI would change management or outcome.  Discussed with son again this morning-hold off on further neuroimaging given low suspicion-and that it would likely not change management or outcome.   Dysphagia: Likely related to dementia-evaluated by SLP-on dysphagia 2 diet.  Doubt he is a candidate for aggressive investigations given his overall frail state.  Goals of care: Continue treatment with IV fluids/IV antibiotics-patient son understands that patient is not a dialysis candidate-explained that given severity of AKI and other issues noted above-that putting him to resuscitation in the event of a cardiac arrest with not be in the patient's best interest.  Patient son Baldo Daub orders entered.  Family aware that if patient's renal function does not improve-apart from initiating hospice care we really do not have any other options.   Diet: Diet Order            DIET DYS 2 Room service appropriate? Yes with Assist; Fluid consistency: Thin  Diet effective now                  Code Status: DNR  Family Communication: Spoke with son Sequeira [phone]) at bedside on 12/11  Disposition Plan: Status is: Inpatient  Remains inpatient appropriate because:Inpatient level of care appropriate due to severity of illness   Dispo:  The patient is from: Home              Anticipated d/c is to: TBD              Anticipated d/c  date is: > 3 days              Patient currently is not medically stable to d/c.    Barriers to Discharge: Severe encephalopathy due to ARF in the setting of obstructive uropathy-needs continued inpatient monitoring of renal function.  Antimicrobial agents: Anti-infectives (From admission, onward)   Start     Dose/Rate Route Frequency Ordered Stop   05/19/20 0900  cefTRIAXone (ROCEPHIN) 1 g in sodium chloride 0.9 % 100 mL IVPB        1 g 200 mL/hr over 30 Minutes Intravenous Every 24 hours 05/19/20 0802         Time spent: 35 minutes-Greater than 50% of this time was spent in counseling, explanation of diagnosis, planning of further management, and coordination of care.  MEDICATIONS: Scheduled Meds: . donepezil  10 mg Oral QHS  . finasteride  5 mg Oral Daily  . memantine  10 mg Oral Daily  . montelukast  10 mg Oral Daily  . rosuvastatin  5 mg Oral Daily  . tamsulosin  0.8 mg Oral Daily   Continuous Infusions: . cefTRIAXone (ROCEPHIN)  IV 1 g (05/20/20 0927)  . dextrose 100 mL/hr at 05/20/20 0338   PRN Meds:.acetaminophen **OR** acetaminophen, ondansetron **OR** ondansetron (ZOFRAN) IV   PHYSICAL EXAM: Vital signs: Vitals:   05/19/20 0603 05/19/20 1325 05/19/20 2102 05/20/20 0443  BP: 103/61 (!) 87/60 108/77 138/73  Pulse: 85 79 83 74  Resp: 16  16 18   Temp: 98.7 F (37.1 C) 98.6 F (37 C) 98.9 F (37.2 C) (!) 97.5 F (36.4 C)  TempSrc: Oral Oral Axillary Axillary  SpO2: 99%  96% 100%  Weight:      Height:       Filed Weights   05/18/20 1320  Weight: 60.3 kg   Body mass index is 15.77 kg/m.   Gen Exam: Still somewhat confused but more awake and alert compared to yesterday.  Following some commands. HEENT:atraumatic, normocephalic Chest: B/L clear to auscultation anteriorly CVS:S1S2 regular Abdomen:soft non tender, non distended Extremities:no edema Neurology: Difficult exam but seems to be moving all 4 extremities.   Skin: no rash  I have personally  reviewed following labs and imaging studies  LABORATORY DATA: CBC: Recent Labs  Lab 05/18/20 1345 05/19/20 0607 05/20/20 0153  WBC 6.4 4.6 6.1  NEUTROABS 4.7  --   --   HGB 14.7 12.2* 13.5  HCT 45.0 38.3* 40.2  MCV 93.2 93.6 90.7  PLT 177 140* 121*    Basic Metabolic Panel: Recent Labs  Lab 05/19/20 0607 05/19/20 1047 05/19/20 1705 05/20/20 0153 05/20/20 0502  NA 158* 159* 158* 162* 160*  K 4.2 3.7 3.5 3.9 3.7  CL 123* 124* 123* 126* 124*  CO2 16* 17* 17* 19* 19*  GLUCOSE 123* 156* 135* 117* 125*  BUN 163* 161* 154* 147* 142*  CREATININE 9.79* 9.73* 9.10* 8.08* 7.55*  CALCIUM 8.2* 8.4* 8.2* 8.6* 8.4*    GFR: Estimated Creatinine Clearance: 6.2 mL/min (A) (by C-G formula based on SCr of 7.55 mg/dL (H)).  Liver Function Tests: Recent Labs  Lab 05/18/20 1345 05/20/20 0153  AST 22 22  ALT 28 23  ALKPHOS 65 51  BILITOT 0.8 0.6  PROT 7.3 5.8*  ALBUMIN 4.0  3.0*   No results for input(s): LIPASE, AMYLASE in the last 168 hours. No results for input(s): AMMONIA in the last 168 hours.  Coagulation Profile: No results for input(s): INR, PROTIME in the last 168 hours.  Cardiac Enzymes: No results for input(s): CKTOTAL, CKMB, CKMBINDEX, TROPONINI in the last 168 hours.  BNP (last 3 results) No results for input(s): PROBNP in the last 8760 hours.  Lipid Profile: No results for input(s): CHOL, HDL, LDLCALC, TRIG, CHOLHDL, LDLDIRECT in the last 72 hours.  Thyroid Function Tests: No results for input(s): TSH, T4TOTAL, FREET4, T3FREE, THYROIDAB in the last 72 hours.  Anemia Panel: No results for input(s): VITAMINB12, FOLATE, FERRITIN, TIBC, IRON, RETICCTPCT in the last 72 hours.  Urine analysis:    Component Value Date/Time   COLORURINE YELLOW 05/18/2020 1541   APPEARANCEUR CLOUDY (A) 05/18/2020 1541   LABSPEC 1.020 05/18/2020 1541   PHURINE 5.5 05/18/2020 1541   GLUCOSEU NEGATIVE 05/18/2020 1541   HGBUR MODERATE (A) 05/18/2020 1541   BILIRUBINUR NEGATIVE  05/18/2020 1541   KETONESUR NEGATIVE 05/18/2020 1541   PROTEINUR 30 (A) 05/18/2020 1541   NITRITE NEGATIVE 05/18/2020 1541   LEUKOCYTESUR SMALL (A) 05/18/2020 1541    Sepsis Labs: Lactic Acid, Venous    Component Value Date/Time   LATICACIDVEN 1.2 05/19/2020 0607    MICROBIOLOGY: Recent Results (from the past 240 hour(s))  Resp Panel by RT-PCR (Flu A&B, Covid) Nasopharyngeal Swab     Status: None   Collection Time: 05/18/20  3:41 PM   Specimen: Nasopharyngeal Swab; Nasopharyngeal(NP) swabs in vial transport medium  Result Value Ref Range Status   SARS Coronavirus 2 by RT PCR NEGATIVE NEGATIVE Final    Comment: (NOTE) SARS-CoV-2 target nucleic acids are NOT DETECTED.  The SARS-CoV-2 RNA is generally detectable in upper respiratory specimens during the acute phase of infection. The lowest concentration of SARS-CoV-2 viral copies this assay can detect is 138 copies/mL. A negative result does not preclude SARS-Cov-2 infection and should not be used as the sole basis for treatment or other patient management decisions. A negative result may occur with  improper specimen collection/handling, submission of specimen other than nasopharyngeal swab, presence of viral mutation(s) within the areas targeted by this assay, and inadequate number of viral copies(<138 copies/mL). A negative result must be combined with clinical observations, patient history, and epidemiological information. The expected result is Negative.  Fact Sheet for Patients:  BloggerCourse.com  Fact Sheet for Healthcare Providers:  SeriousBroker.it  This test is no t yet approved or cleared by the [place] FDA and  has been authorized for detection and/or diagnosis of SARS-CoV-2 by FDA under an Emergency Use Authorization (EUA). This EUA will remain  in effect (meaning this test can be used) for the duration of the COVID-19 declaration under Section 564(b)(1)  of the Act, 21 U.S.C.section 360bbb-3(b)(1), unless the authorization is terminated  or revoked sooner.       Influenza A by PCR NEGATIVE NEGATIVE Final   Influenza B by PCR NEGATIVE NEGATIVE Final    Comment: (NOTE) The Xpert Xpress SARS-CoV-2/FLU/RSV plus assay is intended as an aid in the diagnosis of influenza from Nasopharyngeal swab specimens and should not be used as a sole basis for treatment. Nasal washings and aspirates are unacceptable for Xpert Xpress SARS-CoV-2/FLU/RSV testing.  Fact Sheet for Patients: BloggerCourse.com  Fact Sheet for Healthcare Providers: SeriousBroker.it  This test is not yet approved or cleared by the [place] FDA and has been authorized for detection and/or diagnosis of  SARS-CoV-2 by FDA under an Emergency Use Authorization (EUA). This EUA will remain in effect (meaning this test can be used) for the duration of the COVID-19 declaration under Section 564(b)(1) of the Act, 21 U.S.C. section 360bbb-3(b)(1), unless the authorization is terminated or revoked.  Performed at [facility], [address] [place], [place] [address]     RADIOLOGY STUDIES/RESULTS: CT Head Wo Contrast  Result Date: 05/18/2020 CLINICAL DATA:  Mental status changes. EXAM: CT HEAD WITHOUT CONTRAST TECHNIQUE: Contiguous axial images were obtained from the base of the skull through the vertex without intravenous contrast. COMPARISON:  03/13/2016 FINDINGS: Brain: There is atrophy and chronic small vessel disease changes. No acute intracranial abnormality. Specifically, no hemorrhage, hydrocephalus, mass lesion, acute infarction, or significant intracranial injury. Vascular: No hyperdense vessel or unexpected calcification. Skull: No acute calvarial abnormality. Sinuses/Orbits: Visualized paranasal sinuses and mastoids clear. Orbital soft tissues unremarkable. Other: None IMPRESSION: Atrophy, chronic microvascular  disease. No acute intracranial abnormality. Electronically Signed   By: Charlett Nose M.D.   On: 05/18/2020 15:46   US RENAL  Result Date: 05/19/2020 CLINICAL DATA:  Acute kidney injury EXAM: RENAL / URINARY TRACT ULTRASOUND COMPLETE COMPARISON:  None. FINDINGS: Right Kidney: Renal measurements: 10.5 x 7.3 x 5.2 cm = volume: 209 mL. Mild to moderate hydronephrosis. The renal parenchyma is echogenic. Left Kidney: Renal measurements: 11.9 x 6.5 x 6.7 cm = volume: 271 mL. Moderate to severe hydronephrosis. The renal parenchyma is echogenic in appearance. An echogenic structure with posterior acoustic shadowing measuring up to 0.8 cm is identified in the lower pole collecting system. There is positive color comet tail artifact. This is consistent with nephrolithiasis. Circumscribed anechoic lesion with minimal internal debris present in the interpolar region measuring 1.7 x 1.4 by 1.6 cm. Bladder: A Foley catheter is present within the bladder. The bladder wall appears diffusely thickened with multifocal punctate echogenic foci. Additionally, the prostate gland is markedly enlarged. Other: None. IMPRESSION: 1. Bilateral hydronephrosis, mild to moderate on the right and moderate to severe on the left. This suggests bilateral distal obstruction. Recommend further evaluation with CT scan of the abdomen and pelvis. 2. Abnormal appearance of the bladder which is diffusely thick walled. The wall also appears irregular and contains multifocal punctate echogenic foci. Differential considerations include chronic trabeculation related to bladder outlet obstruction, chronic inflammatory cystitis, and bladder malignancy. Cystoscopy could further evaluate if clinically warranted. 3. Marked prostatomegaly. 4. Echogenic renal parenchyma bilaterally consistent with underlying medical renal disease. Electronically Signed   By: Malachy Moan M.D.   On: 05/19/2020 10:41   DG Chest Portable 1 View  Result Date:  05/18/2020 CLINICAL DATA:  Altered mental status EXAM: PORTABLE CHEST 1 VIEW COMPARISON:  07/22/2018, CT 11/22/2019 FINDINGS: Emphysematous disease. No focal opacity or pleural effusion. Normal cardiomediastinal silhouette. No pneumothorax. IMPRESSION: No active disease. Emphysematous disease. Electronically Signed   By: Jasmine Pang M.D.   On: 05/18/2020 16:03     LOS: 2 days   Jeoffrey Massed, MD  [facility]    To contact the attending provider between 7A-7P or the covering provider during after hours 7P-7A, please log into the web site [email or web address] and access using universal [place] password for that web site. If you do not have the password, please call the hospital operator.  05/20/2020, 9:49 AM

## 2020-05-20 NOTE — Progress Notes (Signed)
Occupational Therapy Evaluation Patient Details Name: Clarence Padilla MRN: [id number] DOB: 1934/04/04 Today's Date: 05/20/2020    History of Present Illness Pt is a 86 y.o. male with dementia, HTN, BPH, HLD-who presented with altered mental status-upon further evaluation-was found to have acute renal failure due to obstructive uropathy and metabolic encephalopathy.  There was report of L sided weakness but CT was negative and unable to lie still for MRI.   Clinical Impression   PTA, pt mobilizes @ modified independent level and has occasional assistance for ADL/IADL tasks. Pt oriented only to self and speech was difficult to understand. Pt required increased assistance today as compared to yesterday's PT session. Max A with bed mobility and Mod A +2 with sit - stand @ RW level. Pt orthostatic. Stood to clean pt due to incontinent of BM and to change linens. Max A with ADL tasks and will need direct assistance with self feeding to increase adequate PO intake due to impaired cognition and generalized weakness. REcommend palliative care consult. Will follow acutely.   Orthostatic BPs  Supine 138/73  Sitting 109/72  Return to supine 127/71            Follow Up Recommendations  SNF;Supervision/Assistance - 24 hour    Equipment Recommendations  Other (comment) (TBA)    Recommendations for Other Services  (Palliative Care Consult)     Precautions / Restrictions Precautions Precautions: Fall Precaution Comments: orthostatic hypotension; at risk for skin breakdown      Mobility Bed Mobility Overal bed mobility: Needs Assistance Bed Mobility: Supine to Sit;Sit to Supine     Supine to sit: Max assist Sit to supine: Max assist;+2 for physical assistance   General bed mobility comments: increased assistance today as compared to yesterday    Transfers Overall transfer level: Needs assistance   Transfers: Sit to/from Stand Sit to Stand: Mod assist;+2 physical assistance               Balance     Sitting balance-Leahy Scale: Poor Sitting balance - Comments: posterior lean     Standing balance-Leahy Scale: Poor                             ADL either performed or assessed with clinical judgement   ADL Overall ADL's : Needs assistance/impaired Eating/Feeding: Maximal assistance Eating/Feeding Details (indicate cue type and reason): poor po intake; difficulty managing utensils Grooming: Moderate assistance   Upper Body Bathing: Moderate assistance;Sitting   Lower Body Bathing: Maximal assistance;Sit to/from stand   Upper Body Dressing : Maximal assistance   Lower Body Dressing: Maximal assistance       Toileting- Clothing Manipulation and Hygiene: Total assistance       Functional mobility during ADLs: Moderate assistance;+2 for physical assistance       Vision  unsure of vision       Perception     Praxis      Pertinent Vitals/Pain Pain Assessment: Faces Faces Pain Scale: No hurt     Hand Dominance Right   Extremity/Trunk Assessment Upper Extremity Assessment Upper Extremity Assessment: Generalized weakness (B shoulder limitations)   Lower Extremity Assessment Lower Extremity Assessment: Defer to PT evaluation   Cervical / Trunk Assessment Cervical / Trunk Assessment: Kyphotic   Communication Communication Communication: Expressive difficulties (difficult to understand)   Cognition Arousal/Alertness: Lethargic Behavior During Therapy: Flat affect Overall Cognitive Status: Impaired/Different from baseline Area of Impairment: Orientation;Attention;Memory;Following commands;Safety/judgement;Awareness;Problem solving  Orientation Level: Disoriented to;Place;Time;Situation Current Attention Level: Focused Memory: Decreased short-term memory Following Commands: Follows one step commands inconsistently Safety/Judgement: Decreased awareness of safety;Decreased awareness of deficits Awareness:  Intellectual Problem Solving: Slow processing;Decreased initiation;Difficulty sequencing;Requires verbal cues;Requires tactile cues     General Comments       Exercises     Shoulder Instructions      Home Living Family/patient expects to be discharged to:: Skilled nursing facility Living Arrangements: Children                                      Prior Functioning/Environment Level of Independence: Needs assistance  Gait / Transfers Assistance Needed: Pt able to ambulate community distances wtihout AD ADL's / Homemaking Assistance Needed: Pt was mostly independent with ADLs (very min assit with lower body dressing); family did IADLs Communication / Swallowing Assistance Needed: HOH          OT Problem List: Decreased strength;Decreased range of motion;Decreased activity tolerance;Impaired balance (sitting and/or standing);Decreased cognition;Decreased safety awareness;Decreased knowledge of use of DME or AE;Cardiopulmonary status limiting activity;Impaired UE functional use      OT Treatment/Interventions: Self-care/ADL training;Therapeutic exercise;Energy conservation;DME and/or AE instruction;Therapeutic activities;Cognitive remediation/compensation;Patient/family education;Balance training    OT Goals(Current goals can be found in the care plan section) Acute Rehab OT Goals Patient Stated Goal: son is open to rehab at d/c OT Goal Formulation: Patient unable to participate in goal setting Time For Goal Achievement: 06/03/20 Potential to Achieve Goals: Fair  OT Frequency: Min 2X/week   Barriers to D/C:            Co-evaluation              AM-PAC OT "6 Clicks" Daily Activity     Outcome Measure Help from another person eating meals?: A Lot Help from another person taking care of personal grooming?: A Lot Help from another person toileting, which includes using toliet, bedpan, or urinal?: Total Help from another person bathing (including washing,  rinsing, drying)?: A Lot Help from another person to put on and taking off regular upper body clothing?: A Lot Help from another person to put on and taking off regular lower body clothing?: A Lot 6 Click Score: 11   End of Session Equipment Utilized During Treatment: Gait belt;Rolling walker Nurse Communication: Mobility status  Activity Tolerance: Patient limited by fatigue Patient left: in bed;with call bell/phone within reach;with bed alarm set;Other (comment) (chari position)  OT Visit Diagnosis: Unsteadiness on feet (R26.81);Other abnormalities of gait and mobility (R26.89);Muscle weakness (generalized) (M62.81);Other symptoms and signs involving cognitive function;Adult, failure to thrive (R62.7)                Time: 1239-1310 OT Time Calculation (min): 31 min Charges:  OT General Charges $OT Visit: 1 Visit OT Evaluation $OT Eval Moderate Complexity: 1 Mod OT Treatments $Self Care/Home Management : 8-22 mins  Luisa Dago, OT/L   Acute OT Clinical Specialist Acute Rehabilitation Services Pager [phone] Office [phone]   [facility] 05/20/2020, 2:22 PM

## 2020-05-21 LAB — BASIC METABOLIC PANEL
Anion gap: 12 (ref 5–15)
BUN: 103 mg/dL — ABNORMAL HIGH (ref 8–23)
CO2: 23 mmol/L (ref 22–32)
Calcium: 8.3 mg/dL — ABNORMAL LOW (ref 8.9–10.3)
Chloride: 120 mmol/L — ABNORMAL HIGH (ref 98–111)
Creatinine, Ser: 4.87 mg/dL — ABNORMAL HIGH (ref 0.61–1.24)
GFR, Estimated: 11 mL/min — ABNORMAL LOW (ref >60)
Glucose, Bld: 154 mg/dL — ABNORMAL HIGH (ref 70–99)
Potassium: 2.9 mmol/L — ABNORMAL LOW (ref 3.5–5.1)
Sodium: 155 mmol/L — ABNORMAL HIGH (ref 135–145)

## 2020-05-21 MED ORDER — POTASSIUM CHLORIDE 10 MEQ/100ML IV SOLN
10.0000 meq | INTRAVENOUS | Status: AC
  Administered 2020-05-21 (×4): 10 meq via INTRAVENOUS
  Filled 2020-05-21 (×4): qty 100

## 2020-05-21 MED ORDER — GLYCOPYRROLATE 0.2 MG/ML IJ SOLN: 0.4000 mg | INTRAMUSCULAR | Status: DC | PRN

## 2020-05-21 MED ORDER — LORAZEPAM 2 MG/ML IJ SOLN
0.5000 mg | INTRAMUSCULAR | Status: DC | PRN
  Administered 2020-05-21 – 2020-05-22 (×2): 1 mg via INTRAVENOUS
  Filled 2020-05-21 (×2): qty 1

## 2020-05-21 MED ORDER — HYDROMORPHONE HCL 1 MG/ML IJ SOLN
0.5000 mg | INTRAMUSCULAR | Status: DC | PRN
  Administered 2020-05-22: 1 mg via INTRAVENOUS
  Filled 2020-05-21: qty 1

## 2020-05-21 NOTE — Plan of Care (Signed)
  Problem: Health Behavior/Discharge Planning: Goal: Ability to manage health-related needs will improve Outcome: Progressing   Problem: Clinical Measurements: Goal: Will remain free from infection Outcome: Progressing Goal: Respiratory complications will improve Outcome: Progressing Goal: Cardiovascular complication will be avoided Outcome: Progressing   Problem: Activity: Goal: Risk for activity intolerance will decrease Outcome: Progressing   Problem: Coping: Goal: Level of anxiety will decrease Outcome: Progressing   Problem: Elimination: Goal: Will not experience complications related to bowel motility Outcome: Progressing Goal: Will not experience complications related to urinary retention Outcome: Progressing   Problem: Pain Managment: Goal: General experience of comfort will improve Outcome: Progressing   Problem: Safety: Goal: Ability to remain free from injury will improve Outcome: Progressing   Problem: Skin Integrity: Goal: Risk for impaired skin integrity will decrease Outcome: Progressing

## 2020-05-21 NOTE — Progress Notes (Signed)
Manufacturing systems engineer [facility]) Hospital Liaison note.   Received request from [facility] manager for family interest in [facility]. [facility] is unable to offer a room today.   Hospital Liaison will follow up tomorrow or sooner if a room becomes available.   A Please do not hesitate to call with questions.   Thank you,  Yolande Jolly, BSN, [facility] (in [place]) [phone]

## 2020-05-21 NOTE — Progress Notes (Signed)
[facility] Progress Note    Assessment/ Plan:   AKI secondary to obstructive uropathy. Non-oliguric. Improving:  -baseline seems to be around 1.3 (as of 07/2018) -peak Cr 12.3, kidney function continues to improve. s/p foley placement, robust urine output thereafter -ultrasound as below -continue with mIVF -I do not believe he is an ideal dialysis candidate (especially long-term) given advanced age, progressively worsening dementia with progressively worsening functionality. See consult HPI, had an extensive discussion about this with son on day of consultation and he is in full agreement about this. Fortunately, his kidney function is improving. -consider GU eval, can be done as an outpatient. Will likely need long-term foley -Continue to monitor daily Cr, Dose meds for GFR<15 -Monitor Daily I/Os, Daily weight  -Maintain MAP>65 for optimal renal perfusion.  -Agree with holding ACE-I, avoid further nephrotoxins including NSAIDS, Morphine.  Unless absolutely necessary, avoid CT with contrast and/or MRI with gadolinium.  Obstructive uropathy  -Renal ultrasound from 12/10 reviewed.    Bilateral hydronephrosis left greater than right.  Possible bilateral distal obstruction which warrants further evaluation with a CT scan of abdomen pelvis (in his case will be without contrast).  There is abnormal appearance of the bladder which is diffusely thick.  Wall also appears irregular and contains multifocal punctate echogenic foci.  Marked prostatomegaly.  Echogenic renal parenchyma bilaterally. Foreseeing long-term Foley.  -CT scan reviewed: moderate bilateral hydroureteronephrosis, likely related to stones which may have already passed -would hold off on any urological intervention given overall clinical status (also since kidney function continues to improve)  Hypernatremia, improving -Free water deficit now 1.5L, continue with d5. Labs once a day  Anion Gap Metabolic acidosis,  improved -secondary to AKI  Acute Metabolic Encephalopathy -does have baseline dementia. No changes  Advanced Dementia -appreciate palliative care's assistance. Possible transition to comfort care after family discussion  Left sided weakness -CTH negative, possible MRI? Will defer to primary service  Hypertension: -not on anti-HTN's, monitor for now. BP acceptable  H/O BPH -restart flomax   Discussed with primary service. Kidney function and hypernatremia improving. Palliative care following along, appreciate recommendations and assistance. Not a candidate for long-term dialysis if required. Given his current clinical status, I do not believe he is a candidate for any urological procedures. Will sign off from a nephrology perspective. Please call with any questions/concerns.  Anthony Sar, MD [facility]  Subjective:   Na improving, mentation still poor. No acute events.    Objective:   BP 110/65 (BP Location: Right Arm)   Pulse 76   Temp 97.7 F (36.5 C) (Oral)   Resp 18   Ht 6\' 5"  (1.956 m)   Wt 60.3 kg   SpO2 100%   BMI 15.77 kg/m   Intake/Output Summary (Last 24 hours) at 05/21/2020 1241 Last data filed at 05/21/2020 1053 Gross per 24 hour  Intake 2063.34 ml  Output 4700 ml  Net -2636.66 ml   Weight change:   Physical Exam: Gen: Chronically ill-appearing, NAD HEENT: Dry mucosal membranes, temporal wasting CVS: S1-S2, RRR, no M/R/G Resp: CTA BL, no W/R/R/C, unlabored Abd: Soft, nontender, nondistended Skin: Poor skin turgor Ext: No edema Neuro: Opens eyes and responds to vocal stimuli, speech not clear  Imaging: CT ABDOMEN PELVIS WO CONTRAST  Result Date: 05/20/2020 CLINICAL DATA:  Hematuria. EXAM: CT ABDOMEN AND PELVIS WITHOUT CONTRAST TECHNIQUE: Multidetector CT imaging of the abdomen and pelvis was performed following the standard protocol without IV contrast. COMPARISON:  None. FINDINGS: Lower chest: No  acute abnormality.  Hepatobiliary: No focal liver abnormality is seen. No gallstones, gallbladder wall thickening, or biliary dilatation. Pancreas: Unremarkable. No pancreatic ductal dilatation or surrounding inflammatory changes. Spleen: Normal in size without focal abnormality. Adrenals/Urinary Tract: Adrenal glands appear normal. Nonobstructive left renal calculi are noted. Moderate left hydroureteronephrosis is noted which appears to be due to 2.5 cm calculus at the left ureterovesical junction. Two smaller calculi are seen more proximally in the distal left ureter, the largest measuring 8 mm. Mild right hydroureteronephrosis is noted with bulbous dilatation of the ureter proximal to the ureterovesical junction. Severe diffuse wall thickening of the urinary bladder is noted concerning for cystitis. Foley catheter is noted. Several small calculi are noted in the dependent portion of the urinary bladder suggesting recently passed stones. Stomach/Bowel: The stomach is unremarkable. There is no evidence of bowel obstruction or inflammation. The appendix appears normal. Large amount of stool is seen in the rectum concerning for impaction. Vascular/Lymphatic: Aortic atherosclerosis. No enlarged abdominal or pelvic lymph nodes. Reproductive: Mild prostatic enlargement is noted. Other: No abdominal wall hernia or abnormality. No abdominopelvic ascites. Musculoskeletal: No acute or significant osseous findings. IMPRESSION: 1. Nonobstructive left renal calculi. 2. Moderate left hydroureteronephrosis is noted which appears to be due to 2.5 cm calculus at the left ureterovesical junction. Two smaller calculi are seen more proximally in the distal left ureter, the largest measuring 8 mm. 3. Mild right hydroureteronephrosis is noted with bulbous dilatation of the ureter proximal to the ureterovesical junction. 4. Severe diffuse wall thickening of the urinary bladder is noted concerning for cystitis. Several small calculi are noted in the  dependent portion of the urinary bladder suggesting recently passed stones. 5. Mild prostatic enlargement. 6. Large amount of stool is seen in the rectum concerning for impaction. 7. Aortic atherosclerosis. Aortic Atherosclerosis (ICD10-I70.0). Electronically Signed   By: Lupita Raider M.D.   On: 05/20/2020 12:10    Labs: BMET Recent Labs  Lab 05/19/20 0607 05/19/20 1047 05/19/20 1705 05/20/20 0153 05/20/20 0502 05/20/20 1757 05/21/20 0440  NA 158* 159* 158* 162* 160* 157* 155*  K 4.2 3.7 3.5 3.9 3.7 3.2* 2.9*  CL 123* 124* 123* 126* 124* 123* 120*  CO2 16* 17* 17* 19* 19* 17* 23  GLUCOSE 123* 156* 135* 117* 125* 159* 154*  BUN 163* 161* 154* 147* 142* 124* 103*  CREATININE 9.79* 9.73* 9.10* 8.08* 7.55* 6.07* 4.87*  CALCIUM 8.2* 8.4* 8.2* 8.6* 8.4* 8.2* 8.3*   CBC Recent Labs  Lab 05/18/20 1345 05/19/20 0607 05/20/20 0153  WBC 6.4 4.6 6.1  NEUTROABS 4.7  --   --   HGB 14.7 12.2* 13.5  HCT 45.0 38.3* 40.2  MCV 93.2 93.6 90.7  PLT 177 140* 121*    Medications:    . donepezil  10 mg Oral QHS  . finasteride  5 mg Oral Daily  . memantine  10 mg Oral Daily  . montelukast  10 mg Oral Daily  . rosuvastatin  5 mg Oral Daily  . tamsulosin  0.8 mg Oral Daily      Anthony Sar, MD [facility] 05/21/2020, 12:41 PM

## 2020-05-21 NOTE — Consult Note (Signed)
I have been asked to see the patient by Dr. Jeoffrey Massed, for evaluation and management of right ureteral calculus and urinary retention.  History of present illness: 86 yo man with dementia, HTN, BPH, HLD-who presented with altered mental status was found to have acute renal failure due to obstructive uropathy.    A Foley catheter was placed and renal function has been improving.  Renal ultrasound was obtained which showed bilateral hydronephrosis with bladder findings consistent with obstructive uropathy.  Subsequently a CT scan was done which showed 2.5 cm left UVJ calculus as well as smaller calculi seen proximal to this in the distal ureter.  There was mild right hydronephrosis on the right side down to the UVJ.  Patient's overall status has declined significantly since last week.  Patient's son is historian today and says prior to this hospitalization patient was walking stairs in his townhouse and would accompany him on outings.  In the last several days he has had difficulty eating and drinking and has profound weakness.   Review of systems: A 12 point comprehensive review of systems was obtained and is negative unless otherwise stated in the history of present illness.  Patient Active Problem List   Diagnosis Date Noted  . Acute metabolic encephalopathy 05/19/2020  . Dementia without behavioral disturbance (HCC) 05/19/2020  . Acute urinary retention 05/19/2020  . Hypernatremia 05/19/2020  . Gross hematuria 05/19/2020  . Acute renal failure (ARF) (HCC) 05/18/2020  . Hyperlipidemia     No current facility-administered medications on file prior to encounter.   Current Outpatient Medications on File Prior to Encounter  Medication Sig Dispense Refill  . aspirin 81 MG chewable tablet Chew 81 mg by mouth daily.    . ciprofloxacin (CIPRO) 250 MG tablet Take 250 mg by mouth 2 (two) times daily.    Marland Kitchen donepezil (ARICEPT) 10 MG tablet Take 10 mg by mouth at bedtime.    . memantine (NAMENDA)  10 MG tablet Take 10 mg by mouth daily.    . montelukast (SINGULAIR) 10 MG tablet Take 10 mg by mouth at bedtime as needed (congestion).    . rosuvastatin (CRESTOR) 5 MG tablet Take 5 mg by mouth daily.    . Tamsulosin HCl (FLOMAX) 0.4 MG CAPS Take 0.8 mg by mouth daily.     . mupirocin ointment (BACTROBAN) 2 % Apply 1 application topically 2 (two) times daily. (Patient not taking: Reported on 05/19/2020) 22 g 0    Past Medical History:  Diagnosis Date  . BPH (benign prostatic hyperplasia)   . COPD (chronic obstructive pulmonary disease) (HCC)   . Dementia (HCC)   . Hyperlipidemia 07/15/12  . Hypertension     Past Surgical History:  Procedure Laterality Date  . CATARACT EXTRACTION W/PHACO Left 05/12/2013   Procedure: CATARACT EXTRACTION PHACO AND INTRAOCULAR LENS PLACEMENT (IOC);  Surgeon: Shade Flood, MD;  [facility];  Service: Ophthalmology;  Laterality: Left;    Social History   Tobacco Use  . Smoking status: Current Every Day Smoker    Packs/day: 1.00    Types: Cigarettes  . Smokeless tobacco: Never Used  Substance Use Topics  . Alcohol use: No    No family history on file.  PE: Vitals:   05/20/20 0443 05/20/20 1419 05/20/20 2103 05/21/20 0604  BP: 138/73 91/61 113/65 110/65  Pulse: 74  79 76  Resp: 18 16 18 18   Temp: (!) 97.5 F (36.4 C)  (!) 97.5 F (36.4 C) 97.7 F (36.5 C)  TempSrc:  Axillary  Axillary Oral  SpO2: 100%  100% 100%  Weight:      Height:       Patient appears to be in no acute distress, appears weak and frail patient awake but difficult to understand Atraumatic normocephalic head No cervical or supraclavicular lymphadenopathy appreciated No increased work of breathing, no audible wheezes/rhonchi Regular sinus rhythm/rate Abdomen is nondistended Foley draining clear yellow urine Lower extremities are symmetric without appreciable edema Grossly neurologically intact No identifiable skin lesions  Recent Labs    05/18/20 1345  05/19/20 0607 05/20/20 0153  WBC 6.4 4.6 6.1  HGB 14.7 12.2* 13.5  HCT 45.0 38.3* 40.2   Recent Labs    05/20/20 0502 05/20/20 1757 05/21/20 0440  NA 160* 157* 155*  K 3.7 3.2* 2.9*  CL 124* 123* 120*  CO2 19* 17* 23  GLUCOSE 125* 159* 154*  BUN 142* 124* 103*  CREATININE 7.55* 6.07* 4.87*  CALCIUM 8.4* 8.2* 8.3*   No results for input(s): LABPT, INR in the last 72 hours. No results for input(s): LABURIN in the last 72 hours. Results for orders placed or performed during the hospital encounter of 05/18/20  Resp Panel by RT-PCR (Flu A&B, Covid) Nasopharyngeal Swab     Status: None   Collection Time: 05/18/20  3:41 PM   Specimen: Nasopharyngeal Swab; Nasopharyngeal(NP) swabs in vial transport medium  Result Value Ref Range Status   SARS Coronavirus 2 by RT PCR NEGATIVE NEGATIVE Final    Comment: (NOTE) SARS-CoV-2 target nucleic acids are NOT DETECTED.  The SARS-CoV-2 RNA is generally detectable in upper respiratory specimens during the acute phase of infection. The lowest concentration of SARS-CoV-2 viral copies this assay can detect is 138 copies/mL. A negative result does not preclude SARS-Cov-2 infection and should not be used as the sole basis for treatment or other patient management decisions. A negative result may occur with  improper specimen collection/handling, submission of specimen other than nasopharyngeal swab, presence of viral mutation(s) within the areas targeted by this assay, and inadequate number of viral copies(<138 copies/mL). A negative result must be combined with clinical observations, patient history, and epidemiological information. The expected result is Negative.  Fact Sheet for Patients:  BloggerCourse.com  Fact Sheet for Healthcare Providers:  SeriousBroker.it  This test is no t yet approved or cleared by the [place] FDA and  has been authorized for detection and/or diagnosis of  SARS-CoV-2 by FDA under an Emergency Use Authorization (EUA). This EUA will remain  in effect (meaning this test can be used) for the duration of the COVID-19 declaration under Section 564(b)(1) of the Act, 21 U.S.C.section 360bbb-3(b)(1), unless the authorization is terminated  or revoked sooner.       Influenza A by PCR NEGATIVE NEGATIVE Final   Influenza B by PCR NEGATIVE NEGATIVE Final    Comment: (NOTE) The Xpert Xpress SARS-CoV-2/FLU/RSV plus assay is intended as an aid in the diagnosis of influenza from Nasopharyngeal swab specimens and should not be used as a sole basis for treatment. Nasal washings and aspirates are unacceptable for Xpert Xpress SARS-CoV-2/FLU/RSV testing.  Fact Sheet for Patients: BloggerCourse.com  Fact Sheet for Healthcare Providers: SeriousBroker.it  This test is not yet approved or cleared by the [place] FDA and has been authorized for detection and/or diagnosis of SARS-CoV-2 by FDA under an Emergency Use Authorization (EUA). This EUA will remain in effect (meaning this test can be used) for the duration of the COVID-19 declaration under Section 564(b)(1) of the Act,  21 U.S.C. section 360bbb-3(b)(1), unless the authorization is terminated or revoked.  Performed at [facility], [address] [place], [place] [address]     Imaging: CT Abd/pelvis 05/20/20 IMPRESSION: 1. Nonobstructive left renal calculi. 2. Moderate left hydroureteronephrosis is noted which appears to be due to 2.5 cm calculus at the left ureterovesical junction. Two smaller calculi are seen more proximally in the distal left ureter, the largest measuring 8 mm. 3. Mild right hydroureteronephrosis is noted with bulbous dilatation of the ureter proximal to the ureterovesical junction. 4. Severe diffuse wall thickening of the urinary bladder is noted concerning for cystitis. Several small calculi are noted in  the dependent portion of the urinary bladder suggesting recently passed stones. 5. Mild prostatic enlargement. 6. Large amount of stool is seen in the rectum concerning for impaction. 7. Aortic atherosclerosis.  Renal US  05/19/20 IMPRESSION: 1. Bilateral hydronephrosis, mild to moderate on the right and moderate to severe on the left. This suggests bilateral distal obstruction. Recommend further evaluation with CT scan of the abdomen and pelvis. 2. Abnormal appearance of the bladder which is diffusely thick walled. The wall also appears irregular and contains multifocal punctate echogenic foci. Differential considerations include chronic trabeculation related to bladder outlet obstruction, chronic inflammatory cystitis, and bladder malignancy. Cystoscopy could further evaluate if clinically warranted. 3. Marked prostatomegaly. 4. Echogenic renal parenchyma bilaterally consistent with underlying medical renal disease.  Assessment/Recommendations: 86 year old man with dementia who presented with altered mental status found to have obstructive uropathy and subsequently found to have 2.5 cm distal left ureteral calculus.  1. Obstructive uropathy with bilateral hydronephrosis: Patient's creatinine has declined significantly since Foley catheter was placed. Recommend starting flomax and continue foley for bladder management.   2. Left ureteral calculi: I had a long discussion with patient's son who is at the bedside regarding management of the left ureteral calculus.  Patient is a poor surgical candidate due to overall clinical status.  I did discuss how surgery would be performed and the need for general anesthesia but I am unsure how well the patient would tolerate surgery.  He is considering hospice care and at this point I do not think surgery will prolong the patient's life.  He does not appear septic and elective ureteroscopy may only modestly improve kidney function in setting of CKD  and osbructive uropathy.  Patient is going to speak with his wife but is leaning against surgery which I think is reasonable.  Please contact urology if patient's family wishes to discuss surgery in future.     Thank you for involving me in this patient's care.  Please page with any further questions or concerns. Shye Doty D Anshika Pethtel

## 2020-05-21 NOTE — Progress Notes (Signed)
PROGRESS NOTE        PATIENT DETAILS Name: Clarence Padilla Age: 86 y.o. Sex: male Date of Birth: June 14, 1933 Admit Date: 05/18/2020 Admitting Physician Jonah Blue, MD EGB:TDVV, Lind Guest, MD  Brief Narrative: Patient is a 86 y.o. male with dementia, HTN, BPH, HLD-who presented with altered mental status-upon further evaluation-was found to have acute renal failure due to obstructive uropathy.  See below for further details  Significant events: 12/9>> presented to [facility] with confusion-found to have ARF due to obstructive uropathy  Significant studies: 12/9>> CT head: No acute intracranial abnormality. 12/9>> chest x-ray: No pneumonia 12/10>> renal ultrasound, bilateral hydronephrosis-thick-walled appearance of bladder. 12/11>> CT abdomen/pelvis: Moderate left hydroureteronephrosis due to 2.5 cm calculi in the left ureterovesical junction.  Mild right hydroureteronephrosis.  Antimicrobial therapy: Rocephin: 12/10>>12/12  Microbiology data: None  Procedures : None  Consults: Nephrology, urology, palliative care.  DVT Prophylaxis :    Subjective: Somewhat confused but remains essentially unchanged compared to yesterday.  Assessment/Plan: ARF due to obstructive uropathy: Foley catheter remains in place-renal function gradually improving-hematuria has resolved.  Appreciate urology/nephrology input.  Ongoing discussion by palliative care with family-decision made to transition to full comfort measures.  He is very frail-given CT abdomen findings-he will be a very poor candidate for aggressive urological procedures.  Appropriate transition to comfort measures.    Hematuria: Probably secondary to BPH-but could have UTI-hematuria has resolved-on Rocephin/finasteride.    Acute metabolic encephalopathy: Secondary to uremia-improving with supportive care-has significant amount of dementia at baseline.  Hypernatremia: Secondary to poor oral intake-slowly  downtrending-subsequently transition to full comfort measures today.  Dementia: Appears to be advanced-continue Aricept/Namenda-expect some amount of delirium during hospital stay  BPH: Was on-Flomax/finasteride-appreciate urology evaluation-now on full comfort measures.   ?  Left-sided weakness: Patient's son claims that on day of presentation-he noted left-sided weakness-overall difficult exam-but do not think patient has any significant left-sided weakness on my exam-could have had some uremic encephalopathy/left-sided weakness-CT head negative.  Given lingering confusion-do not think patient will be able to lie still for MRI-since low suspicion for CVA-do not think that MRI would change management or outcome.  Patient's son in full agreement and not pursuing further investigations including MRI.  Dysphagia: Likely related to dementia-evaluated by SLP-on dysphagia 2 diet.  Doubt he is a candidate for aggressive investigations given his overall frail state.  Goals of care: DNR in place-not a candidate for dialysis-not a candidate for aggressive urological procedures/interventions.  Extensive discussion by this MD initially-subsequently by the palliative care team-has been transitioned to full comfort measures on 12/12-with plans to discharge to residential hospice when bed available.    Diet: Diet Order            DIET DYS 2 Room service appropriate? Yes with Assist; Fluid consistency: Thin  Diet effective now                  Code Status: DNR  Family Communication: Spoke with son Frediani [phone]) at bedside on 12/11  Disposition Plan: Status is: Inpatient  Remains inpatient appropriate because:Inpatient level of care appropriate due to severity of illness   Dispo: The patient is from: Home              Anticipated d/c is to: TBD              Anticipated d/c date  is: > 1 days              Patient currently is medically stable to d/c.    Barriers to Discharge: Awaiting  residential hospice bed  Antimicrobial agents: Anti-infectives (From admission, onward)   Start     Dose/Rate Route Frequency Ordered Stop   05/19/20 0900  cefTRIAXone (ROCEPHIN) 1 g in sodium chloride 0.9 % 100 mL IVPB  Status:  Discontinued        1 g 200 mL/hr over 30 Minutes Intravenous Every 24 hours 05/19/20 0802 05/21/20 1312       Time spent: 35 minutes-Greater than 50% of this time was spent in counseling, explanation of diagnosis, planning of further management, and coordination of care.  MEDICATIONS: Scheduled Meds:  Continuous Infusions:  PRN Meds:.acetaminophen **OR** acetaminophen, glycopyrrolate, HYDROmorphone (DILAUDID) injection, LORazepam, ondansetron **OR** ondansetron (ZOFRAN) IV   PHYSICAL EXAM: Vital signs: Vitals:   05/20/20 1419 05/20/20 2103 05/21/20 0604 05/21/20 1327  BP: 91/61 113/65 110/65 (!) 90/56  Pulse:  79 76 67  Resp: 16 18 18    Temp:  (!) 97.5 F (36.4 C) 97.7 F (36.5 C)   TempSrc:  Axillary Oral   SpO2:  100% 100%   Weight:      Height:       Filed Weights   05/18/20 1320  Weight: 60.3 kg   Body mass index is 15.77 kg/m.   Gen Exam: Confused-but not in any distress. HEENT:atraumatic, normocephalic Chest: B/L clear to auscultation anteriorly CVS:S1S2 regular Abdomen:soft non tender, non distended Extremities:no edema Neurology: Difficult exam-but seems to be moving all 4 extremities. Skin: no rash  I have personally reviewed following labs and imaging studies  LABORATORY DATA: CBC: Recent Labs  Lab 05/18/20 1345 05/19/20 0607 05/20/20 0153  WBC 6.4 4.6 6.1  NEUTROABS 4.7  --   --   HGB 14.7 12.2* 13.5  HCT 45.0 38.3* 40.2  MCV 93.2 93.6 90.7  PLT 177 140* 121*    Basic Metabolic Panel: Recent Labs  Lab 05/19/20 1705 05/20/20 0153 05/20/20 0502 05/20/20 1757 05/21/20 0440  NA 158* 162* 160* 157* 155*  K 3.5 3.9 3.7 3.2* 2.9*  CL 123* 126* 124* 123* 120*  CO2 17* 19* 19* 17* 23  GLUCOSE 135* 117*  125* 159* 154*  BUN 154* 147* 142* 124* 103*  CREATININE 9.10* 8.08* 7.55* 6.07* 4.87*  CALCIUM 8.2* 8.6* 8.4* 8.2* 8.3*    GFR: Estimated Creatinine Clearance: 9.6 mL/min (A) (by C-G formula based on SCr of 4.87 mg/dL (H)).  Liver Function Tests: Recent Labs  Lab 05/18/20 1345 05/20/20 0153  AST 22 22  ALT 28 23  ALKPHOS 65 51  BILITOT 0.8 0.6  PROT 7.3 5.8*  ALBUMIN 4.0 3.0*   No results for input(s): LIPASE, AMYLASE in the last 168 hours. No results for input(s): AMMONIA in the last 168 hours.  Coagulation Profile: No results for input(s): INR, PROTIME in the last 168 hours.  Cardiac Enzymes: No results for input(s): CKTOTAL, CKMB, CKMBINDEX, TROPONINI in the last 168 hours.  BNP (last 3 results) No results for input(s): PROBNP in the last 8760 hours.  Lipid Profile: No results for input(s): CHOL, HDL, LDLCALC, TRIG, CHOLHDL, LDLDIRECT in the last 72 hours.  Thyroid Function Tests: No results for input(s): TSH, T4TOTAL, FREET4, T3FREE, THYROIDAB in the last 72 hours.  Anemia Panel: No results for input(s): VITAMINB12, FOLATE, FERRITIN, TIBC, IRON, RETICCTPCT in the last 72 hours.  Urine analysis:  Component Value Date/Time   COLORURINE YELLOW 05/18/2020 1541   APPEARANCEUR CLOUDY (A) 05/18/2020 1541   LABSPEC 1.020 05/18/2020 1541   PHURINE 5.5 05/18/2020 1541   GLUCOSEU NEGATIVE 05/18/2020 1541   HGBUR MODERATE (A) 05/18/2020 1541   BILIRUBINUR NEGATIVE 05/18/2020 1541   KETONESUR NEGATIVE 05/18/2020 1541   PROTEINUR 30 (A) 05/18/2020 1541   NITRITE NEGATIVE 05/18/2020 1541   LEUKOCYTESUR SMALL (A) 05/18/2020 1541    Sepsis Labs: Lactic Acid, Venous    Component Value Date/Time   LATICACIDVEN 1.2 05/19/2020 0607    MICROBIOLOGY: Recent Results (from the past 240 hour(s))  Resp Panel by RT-PCR (Flu A&B, Covid) Nasopharyngeal Swab     Status: None   Collection Time: 05/18/20  3:41 PM   Specimen: Nasopharyngeal Swab; Nasopharyngeal(NP) swabs in  vial transport medium  Result Value Ref Range Status   SARS Coronavirus 2 by RT PCR NEGATIVE NEGATIVE Final    Comment: (NOTE) SARS-CoV-2 target nucleic acids are NOT DETECTED.  The SARS-CoV-2 RNA is generally detectable in upper respiratory specimens during the acute phase of infection. The lowest concentration of SARS-CoV-2 viral copies this assay can detect is 138 copies/mL. A negative result does not preclude SARS-Cov-2 infection and should not be used as the sole basis for treatment or other patient management decisions. A negative result may occur with  improper specimen collection/handling, submission of specimen other than nasopharyngeal swab, presence of viral mutation(s) within the areas targeted by this assay, and inadequate number of viral copies(<138 copies/mL). A negative result must be combined with clinical observations, patient history, and epidemiological information. The expected result is Negative.  Fact Sheet for Patients:  BloggerCourse.com  Fact Sheet for Healthcare Providers:  SeriousBroker.it  This test is no t yet approved or cleared by the [place] FDA and  has been authorized for detection and/or diagnosis of SARS-CoV-2 by FDA under an Emergency Use Authorization (EUA). This EUA will remain  in effect (meaning this test can be used) for the duration of the COVID-19 declaration under Section 564(b)(1) of the Act, 21 U.S.C.section 360bbb-3(b)(1), unless the authorization is terminated  or revoked sooner.       Influenza A by PCR NEGATIVE NEGATIVE Final   Influenza B by PCR NEGATIVE NEGATIVE Final    Comment: (NOTE) The Xpert Xpress SARS-CoV-2/FLU/RSV plus assay is intended as an aid in the diagnosis of influenza from Nasopharyngeal swab specimens and should not be used as a sole basis for treatment. Nasal washings and aspirates are unacceptable for Xpert Xpress SARS-CoV-2/FLU/RSV testing.  Fact  Sheet for Patients: BloggerCourse.com  Fact Sheet for Healthcare Providers: SeriousBroker.it  This test is not yet approved or cleared by the [place] FDA and has been authorized for detection and/or diagnosis of SARS-CoV-2 by FDA under an Emergency Use Authorization (EUA). This EUA will remain in effect (meaning this test can be used) for the duration of the COVID-19 declaration under Section 564(b)(1) of the Act, 21 U.S.C. section 360bbb-3(b)(1), unless the authorization is terminated or revoked.  Performed at [facility], [address] [place], [place] [address]     RADIOLOGY STUDIES/RESULTS: CT ABDOMEN PELVIS WO CONTRAST  Result Date: 05/20/2020 CLINICAL DATA:  Hematuria. EXAM: CT ABDOMEN AND PELVIS WITHOUT CONTRAST TECHNIQUE: Multidetector CT imaging of the abdomen and pelvis was performed following the standard protocol without IV contrast. COMPARISON:  None. FINDINGS: Lower chest: No acute abnormality. Hepatobiliary: No focal liver abnormality is seen. No gallstones, gallbladder wall thickening, or biliary dilatation. Pancreas:  Unremarkable. No pancreatic ductal dilatation or surrounding inflammatory changes. Spleen: Normal in size without focal abnormality. Adrenals/Urinary Tract: Adrenal glands appear normal. Nonobstructive left renal calculi are noted. Moderate left hydroureteronephrosis is noted which appears to be due to 2.5 cm calculus at the left ureterovesical junction. Two smaller calculi are seen more proximally in the distal left ureter, the largest measuring 8 mm. Mild right hydroureteronephrosis is noted with bulbous dilatation of the ureter proximal to the ureterovesical junction. Severe diffuse wall thickening of the urinary bladder is noted concerning for cystitis. Foley catheter is noted. Several small calculi are noted in the dependent portion of the urinary bladder suggesting recently passed stones.  Stomach/Bowel: The stomach is unremarkable. There is no evidence of bowel obstruction or inflammation. The appendix appears normal. Large amount of stool is seen in the rectum concerning for impaction. Vascular/Lymphatic: Aortic atherosclerosis. No enlarged abdominal or pelvic lymph nodes. Reproductive: Mild prostatic enlargement is noted. Other: No abdominal wall hernia or abnormality. No abdominopelvic ascites. Musculoskeletal: No acute or significant osseous findings. IMPRESSION: 1. Nonobstructive left renal calculi. 2. Moderate left hydroureteronephrosis is noted which appears to be due to 2.5 cm calculus at the left ureterovesical junction. Two smaller calculi are seen more proximally in the distal left ureter, the largest measuring 8 mm. 3. Mild right hydroureteronephrosis is noted with bulbous dilatation of the ureter proximal to the ureterovesical junction. 4. Severe diffuse wall thickening of the urinary bladder is noted concerning for cystitis. Several small calculi are noted in the dependent portion of the urinary bladder suggesting recently passed stones. 5. Mild prostatic enlargement. 6. Large amount of stool is seen in the rectum concerning for impaction. 7. Aortic atherosclerosis. Aortic Atherosclerosis (ICD10-I70.0). Electronically Signed   By: Lupita RaiderJames  Green Jr M.D.   On: 05/20/2020 12:10     LOS: 3 days   Jeoffrey MassedShanker Amrutha Avera, MD  [facility]    To contact the attending provider between 7A-7P or the covering provider during after hours 7P-7A, please log into the web site [email or web address] and access using universal [place] password for that web site. If you do not have the password, please call the hospital operator.  05/21/2020, 2:22 PM

## 2020-05-21 NOTE — Progress Notes (Addendum)
Palliative Medicine Inpatient Follow Up Note  Reason for consult:  Goals of Care  HPI:  Per intake H&P --> Patient is a86 y.o.malewith dementia, HTN, BPH, HLD-who presented with altered mental status-upon further evaluation-was found to have acute renal failure due to obstructive uropathy.   Palliative care asked to get involved to aid in goals of care conversation.  Today's Discussion (05/21/2020): Chart reviewed. I met with Kenn and his son, Treasure Ingrum. At bedside.  I shared with Buddie that there had been no great improvements overnight in his father's condition.   Reviewed the process of aging and progressive nature of multiple comorbid conditions and dementia.  Discussed that when we get to a final stage of dementia which requires full care inability to contain bodily functions and inability to wish or desire to eat or drink any longer that we are at the end stages in which case considering what is important to the patient during his final phases of life is imperative.  I stated that it may be reasonable at this point in time to consider comfort focused care and furthermore inpatient hospice.  We talked about transition to comfort measures in house and what that would entail inclusive of medications to control pain, dyspnea, agitation, nausea, itching, and hiccups.  We discussed stopping all uneccessary measures such as blood draws, needle sticks, and frequent vital signs.   Kiyaan expresses that he to has not noticed any improvement in his father though he would like to discuss these concepts with his wife before determining the next steps.  We plan to speak again later this afternoon.  Discussed the importance of continued conversation with family and their  medical providers regarding overall plan of care and treatment options, ensuring decisions are within the context of the patients values and GOCs.  Questions and concerns addressed   Objective Assessment: Vital Signs Vitals:    05/20/20 2103 05/21/20 0604  BP: 113/65 110/65  Pulse: 79 76  Resp: 18 18  Temp: (!) 97.5 F (36.4 C) 97.7 F (36.5 C)  SpO2: 100% 100%    Intake/Output Summary (Last 24 hours) at 05/21/2020 1004 Last data filed at 05/21/2020 0740 Gross per 24 hour  Intake 2363.34 ml  Output 3500 ml  Net -1136.66 ml   Last Weight  Most recent update: 05/18/2020  1:20 PM   Weight  60.3 kg (133 lb)           Gen:  Frail cachectic elderly AA M HEENT: moist mucous membranes CV: Regular rate and rhythm  PULM: clear to auscultation bilaterally  ABD: soft/nontender  EXT: No edema  Neuro: Alert responds to name - weak w/ poor phonation  SUMMARY OF RECOMMENDATIONS DNAR/DNI  MOST Completed, paper copy placed onto the chart electric copy can be found in [place]  DNR Form Completed, paper copy placed onto the chart electric copy can be found in [employer]. Plan to speak to his wife to determine if they are ready to transition to comfort care and inpatient hospice  Ongoing PMT support  Time Spent: 35 Greater than 50% of the time was spent in counseling and coordination of care ______________________________________________________________________________________ Addendum: I spoke to Platter this afternoon. He confirms that he has spoken to his wife and they have decided that the next best course of action would be to make Duck comfortable. We again reviewed what this would entail. He did understand.   We have liberalized the visitor restrictions in accordance with hospital policy so  that Randal Sr.'s family may come to visit with him.   I have reached out to the [facility] team to verify a referral is sent for IP hospice admission.   Modified orders to be comfort focused.  Ongoing support through the Palliative service during this difficult time.  Time in: 1250 Time Out: 1320 Additional Time: [place] Team Team Cell Phone:  [phone] Please utilize secure chat with additional questions, if there is no response within 30 minutes please call the above phone number  Palliative Medicine Team providers are available by phone from 7am to 7pm daily and can be reached through the team cell phone.  Should this patient require assistance outside of these hours, please call the patient's attending physician.

## 2020-05-21 NOTE — Progress Notes (Signed)
Patient's son requests something be given for anxiety/restlessness d/t will be leaving for a bit. Patient gets anxious when son is not around. Remains on 2LPM via [place]. Ativan administered and effective.

## 2020-05-21 NOTE — TOC Initial Note (Signed)
Transition of Care [facility]) - Initial/Assessment Note    Patient Details  Name: Clarence Padilla MRN: [id number] Date of Birth: 03-23-1934  Transition of Care [facility]) CM/SW Contact:    Verna Czech [place], [place] Phone Number: [phone] 05/21/2020, 2:28 PM  Clinical Narrative:                  Family has made the decision to transition patient to comfort care. Per Palliative Care NP, patient's son in agreement with referral to [facility] or [facility]. [facility] contacted-no bed available today. [facility] contacted-spoke with Tomma Lightning who will review the chart and call this social worker back with bed availability.  Transition of Care to continue to follow.  [address], LCSW Transition of Care [phone]   Expected Discharge Plan: Hospice Medical Facility Barriers to Discharge: No Barriers Identified   Patient Goals and CMS Choice        Expected Discharge Plan and Services Expected Discharge Plan: Hospice Medical Facility In-house Referral: Clinical Social Work Discharge Planning Services: Other - See comment (residential hospice)   Living arrangements for the past 2 months: Single Family Home                                      Prior Living Arrangements/Services Living arrangements for the past 2 months: Single Family Home Lives with:: Adult Children          Need for Family Participation in Patient Care: Yes (Comment) Care giver support system in place?: Yes (comment)   Criminal Activity/Legal Involvement Pertinent to Current Situation/Hospitalization: No - Comment as needed  Activities of Daily Living Home Assistive Devices/Equipment: Other (Comment) (nebulizer) ADL Screening (condition at time of admission) Patient's cognitive ability adequate to safely complete daily activities?: No Is the patient deaf or have difficulty hearing?: No Does the patient have difficulty seeing, even when wearing glasses/contacts?: No  (just llittle. cataracts surgery about 2 years ago per family. ?L eye) Does the patient have difficulty concentrating, remembering, or making decisions?: Yes Patient able to express need for assistance with ADLs?: No Does the patient have difficulty dressing or bathing?: Yes Independently performs ADLs?: No Communication: Needs assistance Is this a change from baseline?: Pre-admission baseline Dressing (OT): Dependent Is this a change from baseline?: Pre-admission baseline Grooming: Dependent Is this a change from baseline?: Pre-admission baseline Feeding: Dependent Is this a change from baseline?: Pre-admission baseline Bathing: Dependent Is this a change from baseline?: Pre-admission baseline Toileting: Dependent Is this a change from baseline?: Pre-admission baseline In/Out Bed: Dependent Is this a change from baseline?: Pre-admission baseline Walks in Home: Dependent Is this a change from baseline?: Pre-admission baseline Does the patient have difficulty walking or climbing stairs?: Yes Weakness of Legs: Both (L side worst per son) Weakness of Arms/Hands: Both (some strength, but weak per son)  Permission Sought/Granted                  Emotional Assessment         Alcohol / Substance Use: Not Applicable Psych Involvement: No (comment)  Admission diagnosis:  Weakness [R53.1] Acute renal failure (ARF) (HCC) [N17.9] AKI (acute kidney injury) (HCC) [N17.9] Patient Active Problem List   Diagnosis Date Noted  . Acute metabolic encephalopathy 05/19/2020  . Dementia without behavioral disturbance (HCC) 05/19/2020  . Acute urinary retention 05/19/2020  . Hypernatremia 05/19/2020  . Gross hematuria 05/19/2020  .  Acute renal failure (ARF) (HCC) 05/18/2020  . Hyperlipidemia    PCP:  Gwenyth Bender, MD Pharmacy:   [facility] [phone] - [place], [place] - [address] AT [facility] [address] [place] [address] Phone:  [phone] Fax: [phone]  [id number] - [address], [place] - [address] [place] [address] Phone: [phone] Fax: [phone]     Social Determinants of Health (SDOH) Interventions    Readmission Risk Interventions No flowsheet data found.

## 2020-05-22 DIAGNOSIS — R338 Other retention of urine: Secondary | ICD-10-CM

## 2020-05-22 MED ORDER — LORAZEPAM 2 MG/ML IJ SOLN: 0.5000 mg | INTRAMUSCULAR | 0 refills | Status: AC | PRN

## 2020-05-22 MED ORDER — HYDROMORPHONE HCL 1 MG/ML IJ SOLN: 0.5000 mg | INTRAMUSCULAR | 0 refills | Status: AC | PRN

## 2020-05-22 NOTE — TOC Progression Note (Addendum)
Transition of Care [facility]) - Progression Note    Patient Details  Name: TYMEL CONELY MRN: [id number] Date of Birth: [birth year]  Transition of [facility]) CM/SW Contact  Lorri Frederick, LCSW Phone Number: 05/22/2020, 10:35 AM  Clinical Narrative:   Pt referred to [facility].  1100: pt son has accepted bed from [facility].  CSW confirmed with pt daughter in the room.  Referral to [facility] cancelled.     Expected Discharge Plan: Hospice Medical Facility Barriers to Discharge: No Barriers Identified  Expected Discharge Plan and Services Expected Discharge Plan: Hospice Medical Facility In-house Referral: Clinical Social Work Discharge Planning Services: Other - See comment (residential hospice)   Living arrangements for the past 2 months: Single Family Home                                       Social Determinants of Health (SDOH) Interventions    Readmission Risk Interventions No flowsheet data found.

## 2020-05-22 NOTE — Plan of Care (Signed)
  Problem: Health Behavior/Discharge Planning: Goal: Ability to manage health-related needs will improve Outcome: Not Applicable   Problem: Clinical Measurements: Goal: Will remain free from infection Outcome: Not Applicable Goal: Diagnostic test results will improve Outcome: Not Applicable Goal: Respiratory complications will improve Outcome: Not Applicable Goal: Cardiovascular complication will be avoided Outcome: Not Applicable   Problem: Activity: Goal: Risk for activity intolerance will decrease Outcome: Not Applicable   Problem: Nutrition: Goal: Adequate nutrition will be maintained Outcome: Not Applicable   Problem: Coping: Goal: Level of anxiety will decrease Outcome: Not Applicable   Problem: Elimination: Goal: Will not experience complications related to bowel motility Outcome: Not Applicable Goal: Will not experience complications related to urinary retention Outcome: Not Applicable   Problem: Pain Managment: Goal: General experience of comfort will improve Outcome: Not Applicable   Problem: Safety: Goal: Ability to remain free from injury will improve Outcome: Not Applicable   Problem: Skin Integrity: Goal: Risk for impaired skin integrity will decrease Outcome: Not Applicable

## 2020-05-22 NOTE — Progress Notes (Signed)
   05/22/20 1000  Clinical Encounter Type  Visited With Patient and family together  Visit Type Initial  Referral From Nurse  Consult/Referral To Chaplain  Chaplain responded to consult. Family at bedside. Chaplain offered prayer and ministry of presence.This note was prepared by Deneen Harts, M.Div..  For questions please contact by phone [phone].

## 2020-05-22 NOTE — Care Management Important Message (Signed)
Important Message  Patient Details  Name: Clarence Padilla MRN: [id number] Date of Birth: Mar 01, 1934   Medicare Important Message Given:  Yes Im was mailed to the home address as the patient left prior to IM delivery.     Demiyah Fischbach 05/22/2020, 3:16 PM

## 2020-05-22 NOTE — Progress Notes (Addendum)
Civil engineer, contracting [facility])  [facility] does not have any bed availability today. Bed was offered to family on Sunday but they declined, likely to pursue closer facility in [place]. Will keep them on our list for now.  Once a bed is available, [facility] will update family and [facility].  Thank you, Wallis Bamberg RN, BSN, CCRN [facility] Liaison

## 2020-05-22 NOTE — Progress Notes (Signed)
   A bed has opened up at our facility [facility] at [facility] in [facility]. I have spoke to the son and he is in agreement to have pt transfer to [place] for end of life care. The pt has been approved by our MD. I have spoke to [facility] and he will reach out to MD and see about d/c orders and arrange ambulance for around 100pm. (if MD in agreement).  The number for the RN to call report is [phone] The son will meet me at 1145am to do paperwork.  Norm Parcel RN [phone]

## 2020-05-22 NOTE — Progress Notes (Signed)
   Referral received over the weekend for inpt unit at [facility] in [facility].  We unfortunately are not able to accommodate this pt today.   Please reach out with concerns. I will reach out if something opens up.  Norm Parcel RN [phone]

## 2020-05-22 NOTE — Discharge Summary (Signed)
PATIENT DETAILS Name: Clarence Padilla Age: 86 y.o. Sex: male Date of Birth: 1934/02/26 MRN: [id number]. Admitting Physician: Jonah Blue, MD KWI:OXBD, Lind Guest, MD  Admit Date: 05/18/2020 Discharge date: 05/22/2020  Recommendations for Outpatient Follow-up:  1. Optimize comfort care 2. Keep Foley for comfort.  Admitted From:  Home  Disposition: Residential Hospice     Home Health: No  Equipment/Devices: None  Discharge Condition:  guarded/hospice  CODE STATUS:  DNR/ Comfort Care  Diet recommendation:  Diet Order            Diet general           DIET DYS 2 Room service appropriate? Yes with Assist; Fluid consistency: Thin  Diet effective now                  Brief Narrative: Patient is a 86 y.o. male with dementia, HTN, BPH, HLD-who presented with altered mental status-upon further evaluation-was found to have acute renal failure due to obstructive uropathy.  See below for further details  Significant events: 12/9>> presented to [facility] with confusion-found to have ARF due to obstructive uropathy  Significant studies: 12/9>> CT head: No acute intracranial abnormality. 12/9>> chest x-ray: No pneumonia 12/10>> renal ultrasound, bilateral hydronephrosis-thick-walled appearance of bladder. 12/11>> CT abdomen/pelvis: Moderate left hydroureteronephrosis due to 2.5 cm calculi in the left ureterovesical junction.  Mild right hydroureteronephrosis.  Antimicrobial therapy: Rocephin: 12/10>>12/12  Microbiology data: None  Procedures : None  Consults: Nephrology, urology, palliative care.  Brief Hospital Course: ARF due to obstructive uropathy:  Treated with IV fluids-Foley catheter placed-hematuria resolved-creatinine slowly trended down.  Nephrology/urology was consulted-recommendations were to proceed with gentle medical treatment-patient was not a candidate for aggressive urological procedures.  Subsequently palliative care was consulted-after  extensive discussion with family-he was transitioned to full comfort measures.  Being discharged to residential hospice today.  Continue Foley catheter on discharge for comfort.  Hematuria: Probably secondary to BPH-but could have UTI-hematuria has resolved-no longer on Rocephin/finasteride.    Acute metabolic encephalopathy: Secondary to uremia-improving with supportive care-has significant amount of dementia at baseline.  Hypernatremia: Secondary to poor oral intake-was slowly downtrending-treated with IV fluids-labs no longer being performed as patient has been transitioned to full comfort measures.    Dementia: Appears to be advanced-no longer on Aricept/Namenda  BPH: Was on-Flomax/finasteride-appreciate urology evaluation-now on full comfort measures.   ?  Left-sided weakness: Patient's son claims that on day of presentation-he noted left-sided weakness-overall difficult exam-but do not think patient has any significant left-sided weakness on my exam-could have had some uremic encephalopathy/left-sided weakness-CT head negative.  Given lingering confusion-do not think patient will be able to lie still for MRI-since low suspicion for CVA-do not think that MRI would change management or outcome.  Patient's son in full agreement and not pursuing further investigations including MRI.  Dysphagia: Likely related to dementia-evaluated by SLP-on dysphagia 2 diet.  Doubt he is a candidate for aggressive investigations given his overall frail state.  Goals of care: DNR in place-not a candidate for dialysis-not a candidate for aggressive urological procedures/interventions.  Extensive discussion by this MD initially-subsequently by the palliative care team-has been transitioned to full comfort measures on 12/12-with plans to discharge to residential hospice when bed available.      Discharge Diagnoses:  Principal Problem:   Acute renal failure (ARF) (HCC) Active Problems:   Acute metabolic  encephalopathy   Dementia without behavioral disturbance (HCC)   Acute urinary retention   Hypernatremia   Gross hematuria  Discharge Instructions:  Activity:  As tolerated with Full fall precautions use walker/cane & assistance as needed  Discharge Instructions    Diet general   Complete by: As directed      Allergies as of 05/22/2020      Reactions   Lovastatin Nausea And Vomiting      Medication List    STOP taking these medications   aspirin 81 MG chewable tablet   ciprofloxacin 250 MG tablet Commonly known as: CIPRO   donepezil 10 MG tablet Commonly known as: ARICEPT   memantine 10 MG tablet Commonly known as: NAMENDA   montelukast 10 MG tablet Commonly known as: SINGULAIR   mupirocin ointment 2 % Commonly known as: Bactroban   rosuvastatin 5 MG tablet Commonly known as: CRESTOR   tamsulosin 0.4 MG Caps capsule Commonly known as: FLOMAX     TAKE these medications   HYDROmorphone 1 MG/ML injection Commonly known as: DILAUDID Inject 0.5-1 mLs (0.5-1 mg total) into the vein every hour as needed (Dyspnea/Pain).   LORazepam 2 MG/ML injection Commonly known as: ATIVAN Inject 0.25-0.5 mLs (0.5-1 mg total) into the vein every hour as needed for sedation, seizure or anxiety.       Allergies  Allergen Reactions  . Lovastatin Nausea And Vomiting     Other Procedures/Studies: CT ABDOMEN PELVIS WO CONTRAST  Result Date: 05/20/2020 CLINICAL DATA:  Hematuria. EXAM: CT ABDOMEN AND PELVIS WITHOUT CONTRAST TECHNIQUE: Multidetector CT imaging of the abdomen and pelvis was performed following the standard protocol without IV contrast. COMPARISON:  None. FINDINGS: Lower chest: No acute abnormality. Hepatobiliary: No focal liver abnormality is seen. No gallstones, gallbladder wall thickening, or biliary dilatation. Pancreas: Unremarkable. No pancreatic ductal dilatation or surrounding inflammatory changes. Spleen: Normal in size without focal abnormality.  Adrenals/Urinary Tract: Adrenal glands appear normal. Nonobstructive left renal calculi are noted. Moderate left hydroureteronephrosis is noted which appears to be due to 2.5 cm calculus at the left ureterovesical junction. Two smaller calculi are seen more proximally in the distal left ureter, the largest measuring 8 mm. Mild right hydroureteronephrosis is noted with bulbous dilatation of the ureter proximal to the ureterovesical junction. Severe diffuse wall thickening of the urinary bladder is noted concerning for cystitis. Foley catheter is noted. Several small calculi are noted in the dependent portion of the urinary bladder suggesting recently passed stones. Stomach/Bowel: The stomach is unremarkable. There is no evidence of bowel obstruction or inflammation. The appendix appears normal. Large amount of stool is seen in the rectum concerning for impaction. Vascular/Lymphatic: Aortic atherosclerosis. No enlarged abdominal or pelvic lymph nodes. Reproductive: Mild prostatic enlargement is noted. Other: No abdominal wall hernia or abnormality. No abdominopelvic ascites. Musculoskeletal: No acute or significant osseous findings. IMPRESSION: 1. Nonobstructive left renal calculi. 2. Moderate left hydroureteronephrosis is noted which appears to be due to 2.5 cm calculus at the left ureterovesical junction. Two smaller calculi are seen more proximally in the distal left ureter, the largest measuring 8 mm. 3. Mild right hydroureteronephrosis is noted with bulbous dilatation of the ureter proximal to the ureterovesical junction. 4. Severe diffuse wall thickening of the urinary bladder is noted concerning for cystitis. Several small calculi are noted in the dependent portion of the urinary bladder suggesting recently passed stones. 5. Mild prostatic enlargement. 6. Large amount of stool is seen in the rectum concerning for impaction. 7. Aortic atherosclerosis. Aortic Atherosclerosis (ICD10-I70.0). Electronically Signed    By: Lupita Raider M.D.   On: 05/20/2020 12:10   CT Head  Wo Contrast  Result Date: 05/18/2020 CLINICAL DATA:  Mental status changes. EXAM: CT HEAD WITHOUT CONTRAST TECHNIQUE: Contiguous axial images were obtained from the base of the skull through the vertex without intravenous contrast. COMPARISON:  03/13/2016 FINDINGS: Brain: There is atrophy and chronic small vessel disease changes. No acute intracranial abnormality. Specifically, no hemorrhage, hydrocephalus, mass lesion, acute infarction, or significant intracranial injury. Vascular: No hyperdense vessel or unexpected calcification. Skull: No acute calvarial abnormality. Sinuses/Orbits: Visualized paranasal sinuses and mastoids clear. Orbital soft tissues unremarkable. Other: None IMPRESSION: Atrophy, chronic microvascular disease. No acute intracranial abnormality. Electronically Signed   By: Charlett NoseKevin  Dover M.D.   On: 05/18/2020 15:46   US RENAL  Result Date: 05/19/2020 CLINICAL DATA:  Acute kidney injury EXAM: RENAL / URINARY TRACT ULTRASOUND COMPLETE COMPARISON:  None. FINDINGS: Right Kidney: Renal measurements: 10.5 x 7.3 x 5.2 cm = volume: 209 mL. Mild to moderate hydronephrosis. The renal parenchyma is echogenic. Left Kidney: Renal measurements: 11.9 x 6.5 x 6.7 cm = volume: 271 mL. Moderate to severe hydronephrosis. The renal parenchyma is echogenic in appearance. An echogenic structure with posterior acoustic shadowing measuring up to 0.8 cm is identified in the lower pole collecting system. There is positive color comet tail artifact. This is consistent with nephrolithiasis. Circumscribed anechoic lesion with minimal internal debris present in the interpolar region measuring 1.7 x 1.4 by 1.6 cm. Bladder: A Foley catheter is present within the bladder. The bladder wall appears diffusely thickened with multifocal punctate echogenic foci. Additionally, the prostate gland is markedly enlarged. Other: None. IMPRESSION: 1. Bilateral hydronephrosis,  mild to moderate on the right and moderate to severe on the left. This suggests bilateral distal obstruction. Recommend further evaluation with CT scan of the abdomen and pelvis. 2. Abnormal appearance of the bladder which is diffusely thick walled. The wall also appears irregular and contains multifocal punctate echogenic foci. Differential considerations include chronic trabeculation related to bladder outlet obstruction, chronic inflammatory cystitis, and bladder malignancy. Cystoscopy could further evaluate if clinically warranted. 3. Marked prostatomegaly. 4. Echogenic renal parenchyma bilaterally consistent with underlying medical renal disease. Electronically Signed   By: Malachy MoanHeath  McCullough M.D.   On: 05/19/2020 10:41   DG Chest Portable 1 View  Result Date: 05/18/2020 CLINICAL DATA:  Altered mental status EXAM: PORTABLE CHEST 1 VIEW COMPARISON:  07/22/2018, CT 11/22/2019 FINDINGS: Emphysematous disease. No focal opacity or pleural effusion. Normal cardiomediastinal silhouette. No pneumothorax. IMPRESSION: No active disease. Emphysematous disease. Electronically Signed   By: Jasmine PangKim  Fujinaga M.D.   On: 05/18/2020 16:03     TODAY-DAY OF DISCHARGE:  Subjective:   Darel HongJames Matto today remains pleasantly confused.  Objective:   Blood pressure 110/67, pulse 74, temperature (!) 97.5 F (36.4 C), temperature source Axillary, resp. rate 16, height 6\' 5"  (1.956 m), weight 60.3 kg, SpO2 92 %.  Intake/Output Summary (Last 24 hours) at 05/22/2020 1128 Last data filed at 05/22/2020 [id number] Gross per 24 hour  Intake 600 ml  Output 2000 ml  Net -1400 ml   Filed Weights   05/18/20 1320  Weight: 60.3 kg    Exam: Awake Alert, Oriented *3, No new F.N deficits, Normal affect [place].AT,PERRAL Supple Neck,No JVD, No cervical lymphadenopathy appriciated.  Symmetrical Chest wall movement, Good air movement bilaterally, CTAB RRR,No Gallops,Rubs or new Murmurs, No Parasternal Heave +ve B.Sounds, Abd Soft, Non  tender, No organomegaly appriciated, No rebound -guarding or rigidity. No Cyanosis, Clubbing or edema, No new Rash or bruise   PERTINENT RADIOLOGIC STUDIES: No results found.  PERTINENT LAB RESULTS: CBC: Recent Labs    05/20/20 0153  WBC 6.1  HGB 13.5  HCT 40.2  PLT 121*   CMET CMP     Component Value Date/Time   NA 155 (H) 05/21/2020 0440   NA 142 02/17/2012 0000   K 2.9 (L) 05/21/2020 0440   CL 120 (H) 05/21/2020 0440   CO2 23 05/21/2020 0440   GLUCOSE 154 (H) 05/21/2020 0440   BUN 103 (H) 05/21/2020 0440   BUN 12 02/17/2012 0000   CREATININE 4.87 (H) 05/21/2020 0440   CALCIUM 8.3 (L) 05/21/2020 0440   PROT 5.8 (L) 05/20/2020 0153   ALBUMIN 3.0 (L) 05/20/2020 0153   AST 22 05/20/2020 0153   ALT 23 05/20/2020 0153   ALKPHOS 51 05/20/2020 0153   BILITOT 0.6 05/20/2020 0153   GFRNONAA 11 (L) 05/21/2020 0440   GFRAA >60 07/11/2018 0201    GFR Estimated Creatinine Clearance: 9.6 mL/min (A) (by C-G formula based on SCr of 4.87 mg/dL (H)). No results for input(s): LIPASE, AMYLASE in the last 72 hours. No results for input(s): CKTOTAL, CKMB, CKMBINDEX, TROPONINI in the last 72 hours. Invalid input(s): POCBNP No results for input(s): DDIMER in the last 72 hours. No results for input(s): HGBA1C in the last 72 hours. No results for input(s): CHOL, HDL, LDLCALC, TRIG, CHOLHDL, LDLDIRECT in the last 72 hours. No results for input(s): TSH, T4TOTAL, T3FREE, THYROIDAB in the last 72 hours.  Invalid input(s): FREET3 No results for input(s): VITAMINB12, FOLATE, FERRITIN, TIBC, IRON, RETICCTPCT in the last 72 hours. Coags: No results for input(s): INR in the last 72 hours.  Invalid input(s): PT Microbiology: Recent Results (from the past 240 hour(s))  Resp Panel by RT-PCR (Flu A&B, Covid) Nasopharyngeal Swab     Status: None   Collection Time: 05/18/20  3:41 PM   Specimen: Nasopharyngeal Swab; Nasopharyngeal(NP) swabs in vial transport medium  Result Value Ref Range  Status   SARS Coronavirus 2 by RT PCR NEGATIVE NEGATIVE Final    Comment: (NOTE) SARS-CoV-2 target nucleic acids are NOT DETECTED.  The SARS-CoV-2 RNA is generally detectable in upper respiratory specimens during the acute phase of infection. The lowest concentration of SARS-CoV-2 viral copies this assay can detect is 138 copies/mL. A negative result does not preclude SARS-Cov-2 infection and should not be used as the sole basis for treatment or other patient management decisions. A negative result may occur with  improper specimen collection/handling, submission of specimen other than nasopharyngeal swab, presence of viral mutation(s) within the areas targeted by this assay, and inadequate number of viral copies(<138 copies/mL). A negative result must be combined with clinical observations, patient history, and epidemiological information. The expected result is Negative.  Fact Sheet for Patients:  BloggerCourse.com  Fact Sheet for Healthcare Providers:  SeriousBroker.it  This test is no t yet approved or cleared by the [place] FDA and  has been authorized for detection and/or diagnosis of SARS-CoV-2 by FDA under an Emergency Use Authorization (EUA). This EUA will remain  in effect (meaning this test can be used) for the duration of the COVID-19 declaration under Section 564(b)(1) of the Act, 21 U.S.C.section 360bbb-3(b)(1), unless the authorization is terminated  or revoked sooner.       Influenza A by PCR NEGATIVE NEGATIVE Final   Influenza B by PCR NEGATIVE NEGATIVE Final    Comment: (NOTE) The Xpert Xpress SARS-CoV-2/FLU/RSV plus assay is intended as an aid in the diagnosis of influenza from Nasopharyngeal swab specimens and should not  be used as a sole basis for treatment. Nasal washings and aspirates are unacceptable for Xpert Xpress SARS-CoV-2/FLU/RSV testing.  Fact Sheet for  Patients: BloggerCourse.com  Fact Sheet for Healthcare Providers: SeriousBroker.it  This test is not yet approved or cleared by the [place] FDA and has been authorized for detection and/or diagnosis of SARS-CoV-2 by FDA under an Emergency Use Authorization (EUA). This EUA will remain in effect (meaning this test can be used) for the duration of the COVID-19 declaration under Section 564(b)(1) of the Act, 21 U.S.C. section 360bbb-3(b)(1), unless the authorization is terminated or revoked.  Performed at [facility], [address] [place], [place] [address]     FURTHER DISCHARGE INSTRUCTIONS:  Get Medicines reviewed and adjusted: Please take all your medications with you for your next visit with your Primary MD  Laboratory/radiological data: Please request your Primary MD to go over all hospital tests and procedure/radiological results at the follow up, please ask your Primary MD to get all Hospital records sent to his/her office.  In some cases, they will be blood work, cultures and biopsy results pending at the time of your discharge. Please request that your primary care M.D. goes through all the records of your hospital data and follows up on these results.  Also Note the following: If you experience worsening of your admission symptoms, develop shortness of breath, life threatening emergency, suicidal or homicidal thoughts you must seek medical attention immediately by calling 911 or calling your MD immediately  if symptoms less severe.  You must read complete instructions/literature along with all the possible adverse reactions/side effects for all the Medicines you take and that have been prescribed to you. Take any new Medicines after you have completely understood and accpet all the possible adverse reactions/side effects.   Do not drive when taking Pain medications or sleeping medications  (Benzodaizepines)  Do not take more than prescribed Pain, Sleep and Anxiety Medications. It is not advisable to combine anxiety,sleep and pain medications without talking with your primary care practitioner  Special Instructions: If you have smoked or chewed Tobacco  in the last 2 yrs please stop smoking, stop any regular Alcohol  and or any Recreational drug use.  Wear Seat belts while driving.  Please note: You were cared for by a hospitalist during your hospital stay. Once you are discharged, your primary care physician will handle any further medical issues. Please note that NO REFILLS for any discharge medications will be authorized once you are discharged, as it is imperative that you return to your primary care physician (or establish a relationship with a primary care physician if you do not have one) for your post hospital discharge needs so that they can reassess your need for medications and monitor your lab values.  Total Time spent coordinating discharge including counseling, education and face to face time equals 25  minutes.  SignedJeoffrey Massed 05/22/2020 11:28 AM

## 2020-05-22 NOTE — Progress Notes (Signed)
Patient remains on comfort care.

## 2020-05-22 NOTE — TOC Transition Note (Signed)
Transition of Care [facility]) - CM/SW Discharge Note   Patient Details  Name: Clarence Padilla MRN: [id number] Date of Birth: 1934-03-23  [facility]) CM/SW Contact:  Lorri Frederick, LCSW Phone Number: 05/22/2020, 12:05 PM   Clinical Narrative:   Pt discharging to [facility].  RN call report to [phone].    Final next level of care: Hospice Medical Facility Barriers to Discharge: No Barriers Identified   Patient Goals and CMS Choice        Discharge Placement              Patient chooses bed at:  [facility]) Patient to be transferred to facility by: [facility] Name of family member notified: daughter [name] Patient and family notified of of transfer: 05/22/20  Discharge Plan and Services In-house Referral: Clinical Social Work Discharge Planning Services: Other - See comment (residential hospice)                                 Social Determinants of Health (SDOH) Interventions     Readmission Risk Interventions No flowsheet data found.

## 2020-05-25 ENCOUNTER — Other Ambulatory Visit: Payer: Medicare HMO

## 2020-06-06 ENCOUNTER — Ambulatory Visit: Payer: Medicare HMO | Admitting: Podiatry

## 2020-06-10 DEATH — deceased

## 2020-11-16 ENCOUNTER — Telehealth: Payer: Self-pay

## 2020-11-16 NOTE — Telephone Encounter (Signed)
error 

## 2021-06-17 IMAGING — CT CT HEAD W/O CM
3 series · 16 of 47 positions shown, 19 images · non-contrast
Comparison: 03/13/2016

CLINICAL DATA: Mental status changes.

EXAM:
CT HEAD WITHOUT CONTRAST
TECHNIQUE: Contiguous axial images were obtained from the base of the skull
through the vertex without intravenous contrast.

[Series 2: head wo · axial · 0.42mm/px · z∈[-163,-28]mm · 10 of 33 slices shown, 13 images]
[im 3/33  brain]
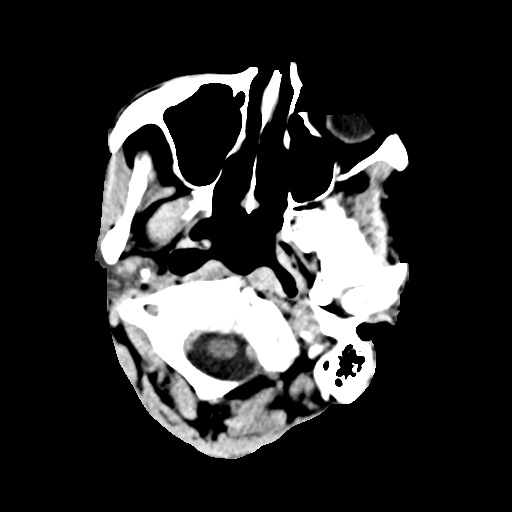
[im 3/33  bone]
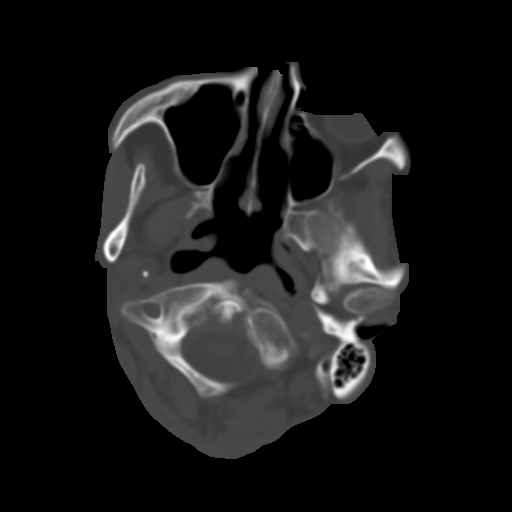
[im 6/33  brain]
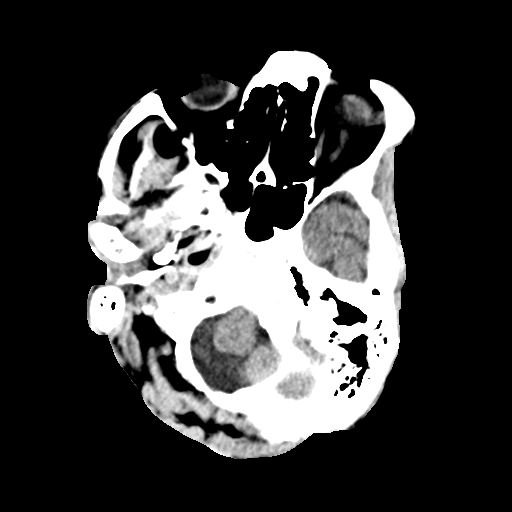
[im 9/33  brain]
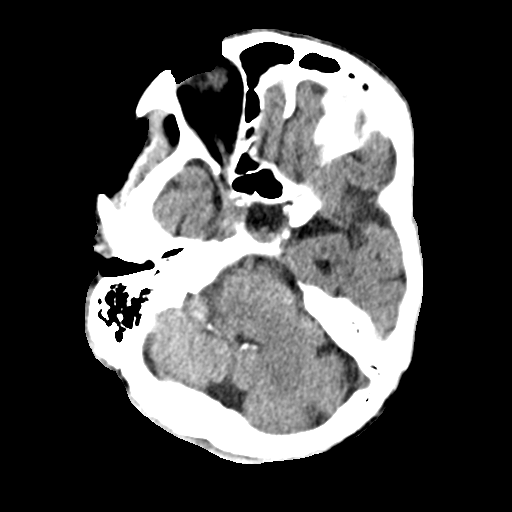
[im 12/33  brain]
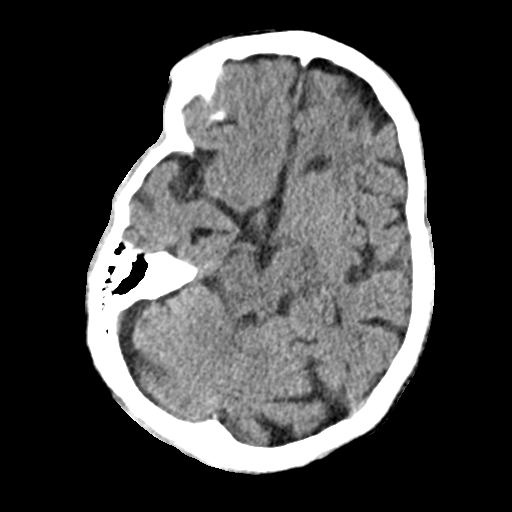
[im 15/33  brain]
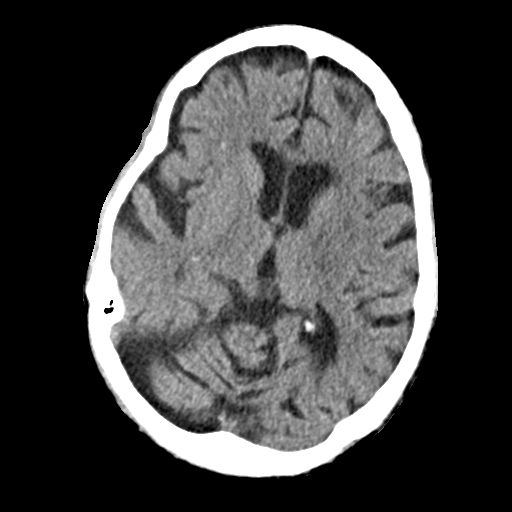
[im 15/33  bone]
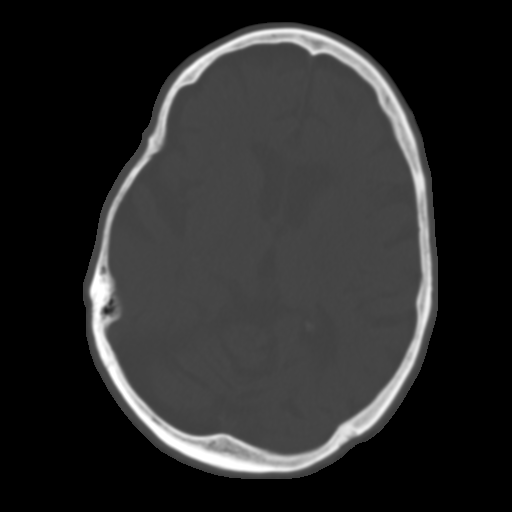
[im 18/33  brain]
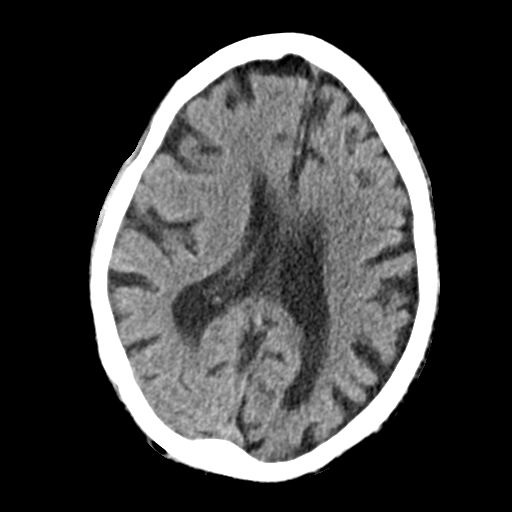
[im 21/33  brain]
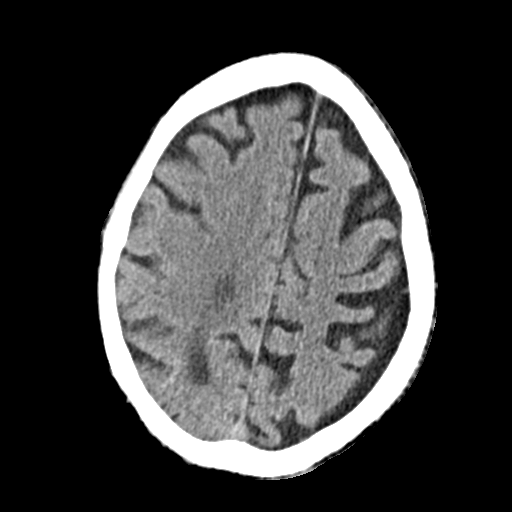
[im 25/33  brain]
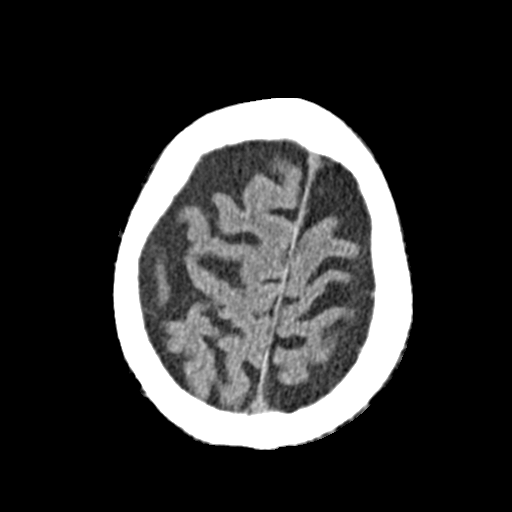
[im 27/33  brain]
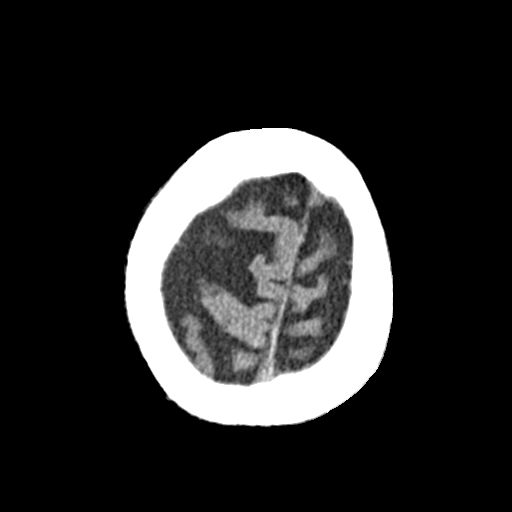
[im 27/33  bone]
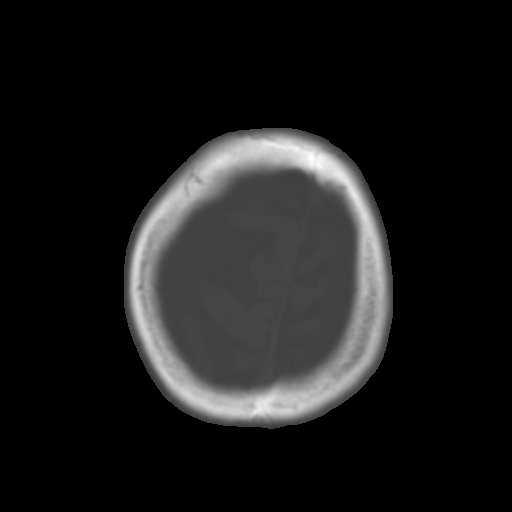
[im 30/33  brain]
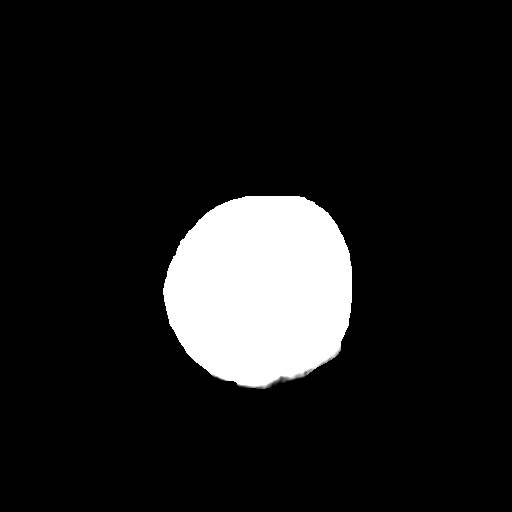

[Series 4: coronal soft · coronal · 0.34mm/px · 3 of 67 slices shown]
[im 23/67  brain]
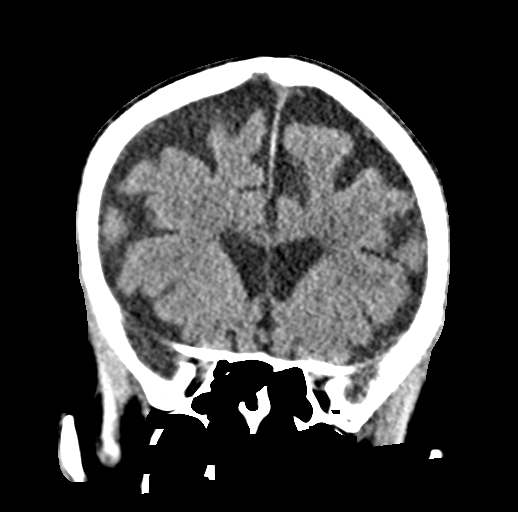
[im 30/67  brain]
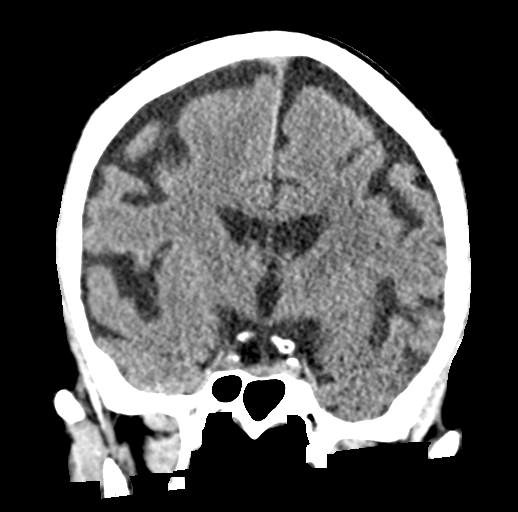
[im 37/67  brain]
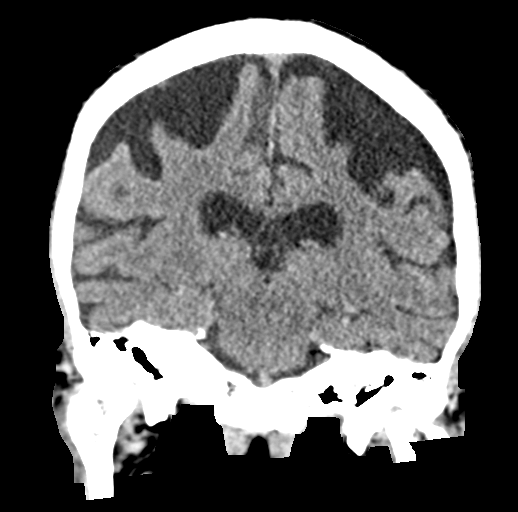

[Series 5: sag soft · sagittal · 0.36mm/px · 3 of 53 slices shown]
[im 21/53  brain]
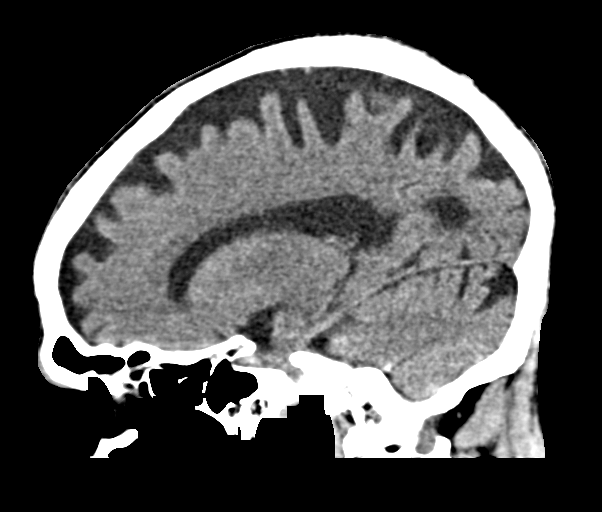
[im 27/53  brain]
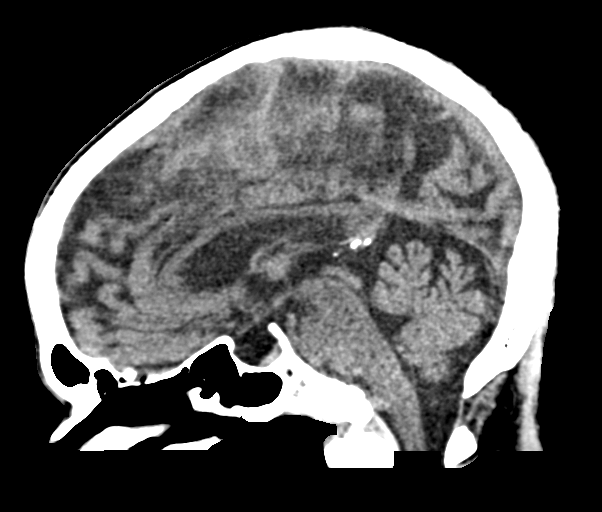
[im 32/53  brain]
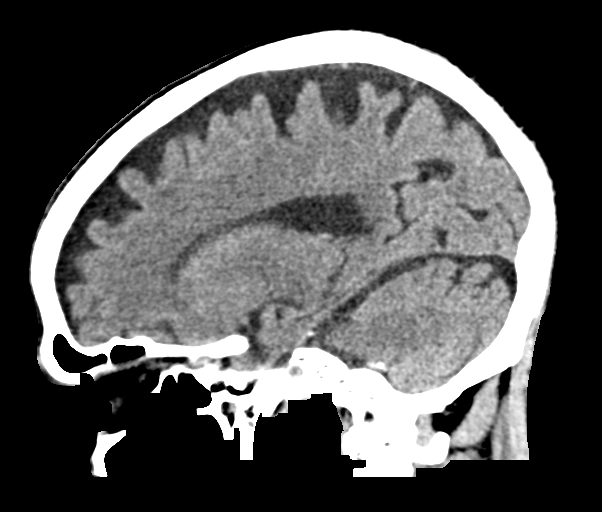

[16 of 47 positions shown; findings below may reference images not displayed]

FINDINGS: Brain: There is atrophy and chronic small vessel disease changes. No
acute intracranial abnormality. Specifically, no hemorrhage,
hydrocephalus, mass lesion, acute infarction, or significant
intracranial injury.

Vascular: No hyperdense vessel or unexpected calcification.

Skull: No acute calvarial abnormality.

Sinuses/Orbits: Visualized paranasal sinuses and mastoids clear.
Orbital soft tissues unremarkable.

Other: None
IMPRESSION: Atrophy, chronic microvascular disease.

No acute intracranial abnormality.

## 2021-06-18 IMAGING — US US RENAL
1 series · 13 of 25 positions shown · non-contrast
Comparison: None.

CLINICAL DATA: Acute kidney injury

EXAM:
RENAL / URINARY TRACT ULTRASOUND COMPLETE

[Series 1: us renal · 13 of 67 slices shown]
[im 1/67]
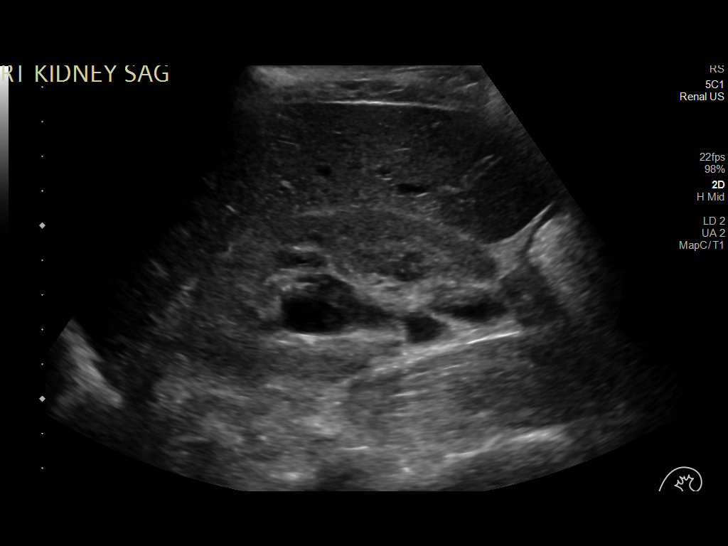
[im 6/67]
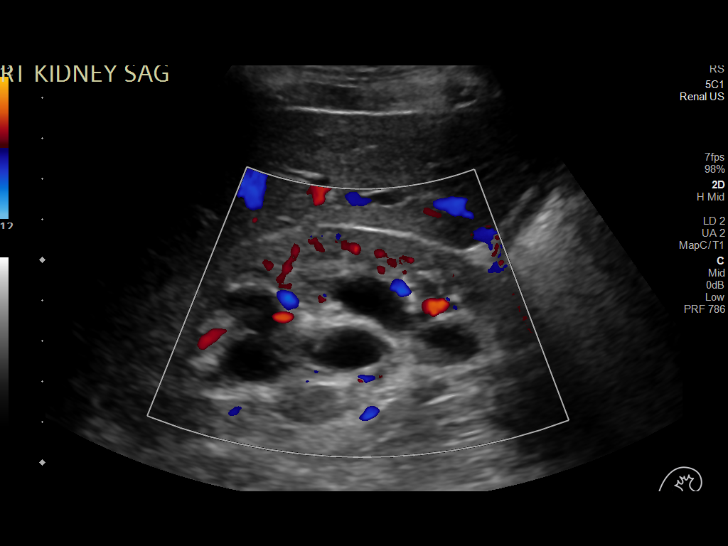
[im 12/67]
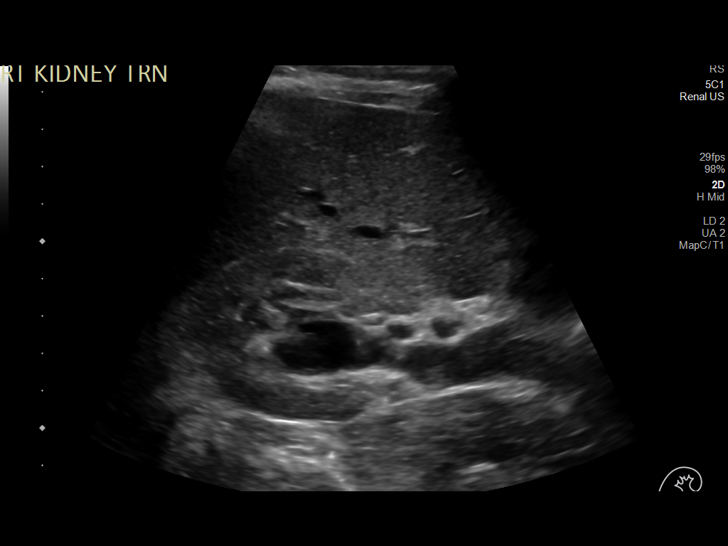
[im 17/67]
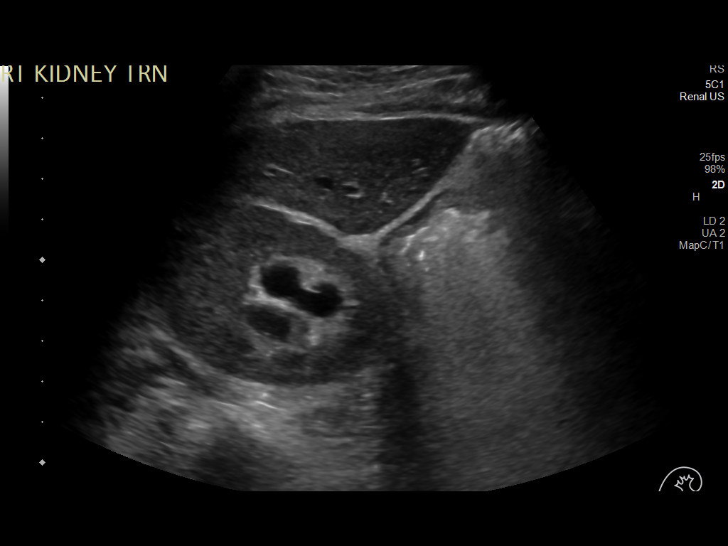
[im 23/67]
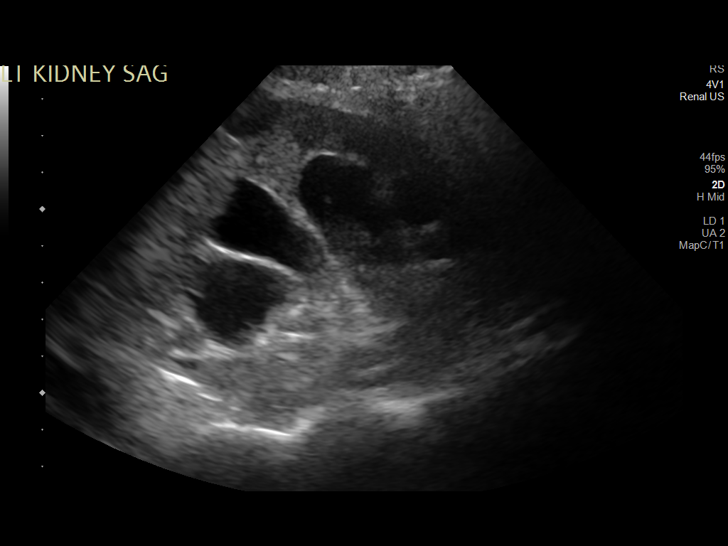
[im 28/67]
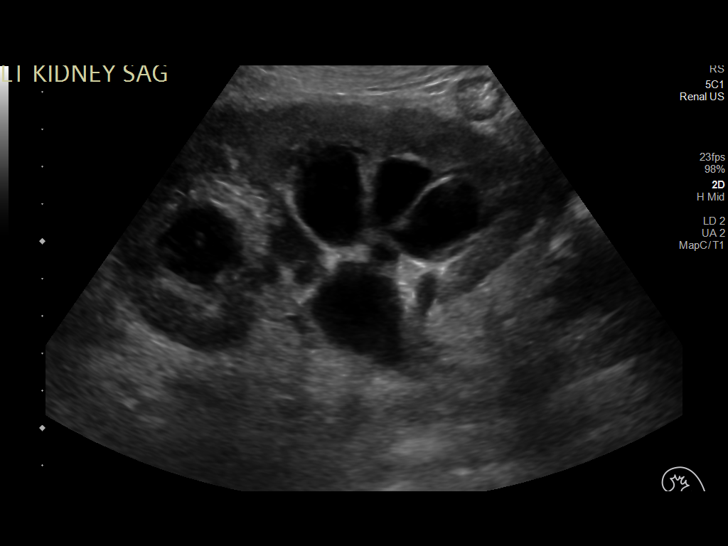
[im 34/67]
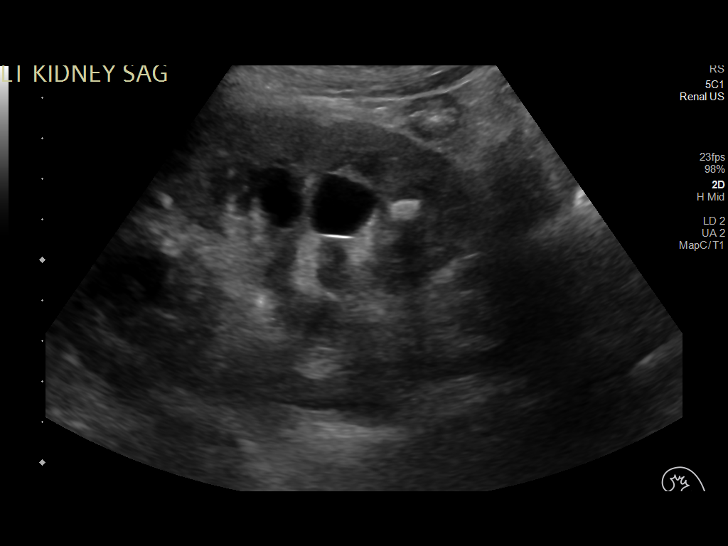
[im 39/67]
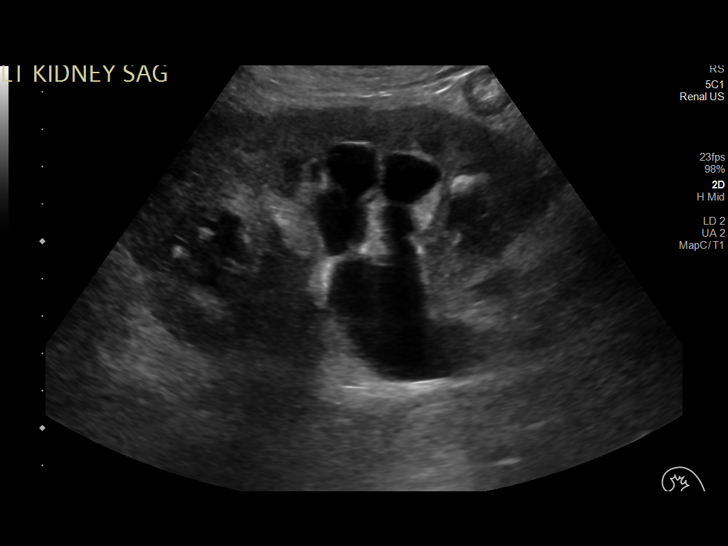
[im 45/67]
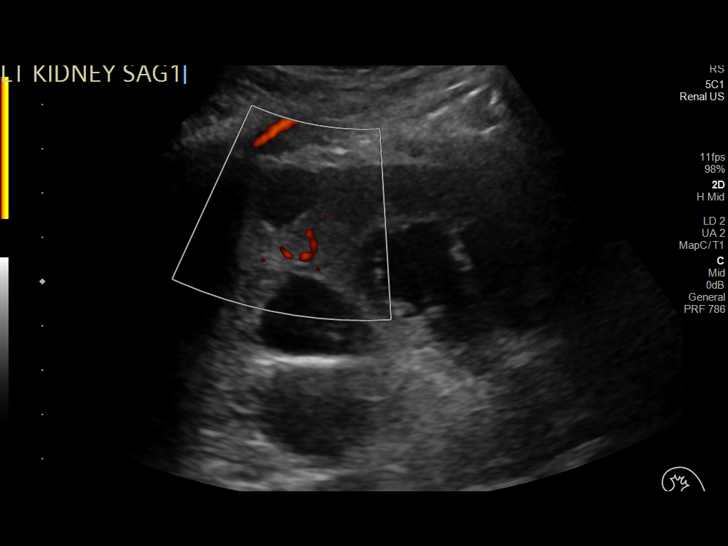
[im 50/67]
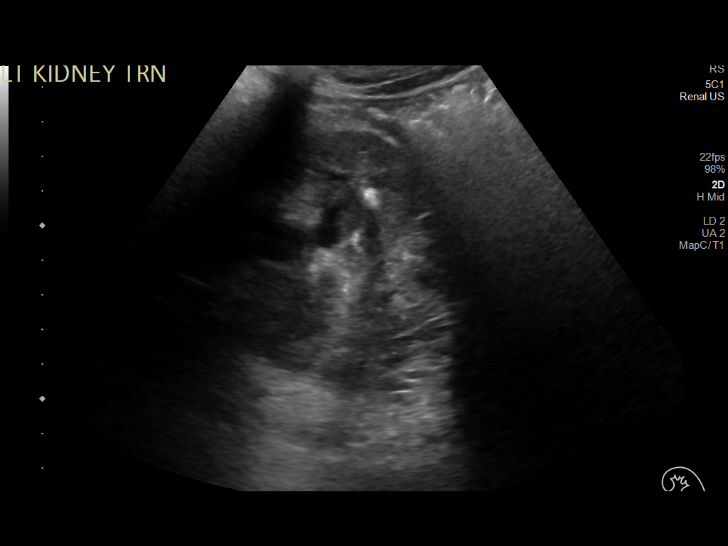
[im 56/67]
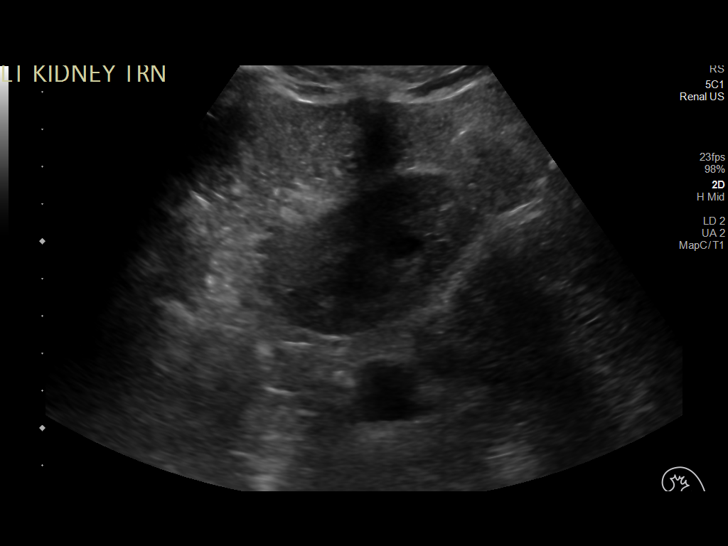
[im 61/67]
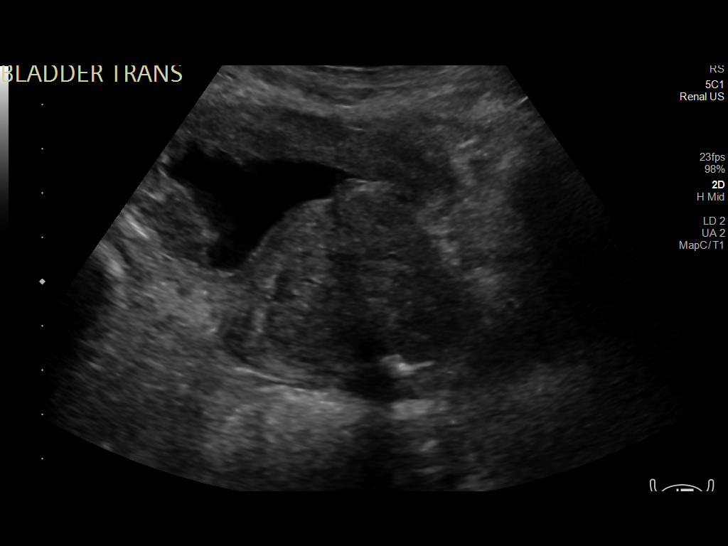
[im 67/67]
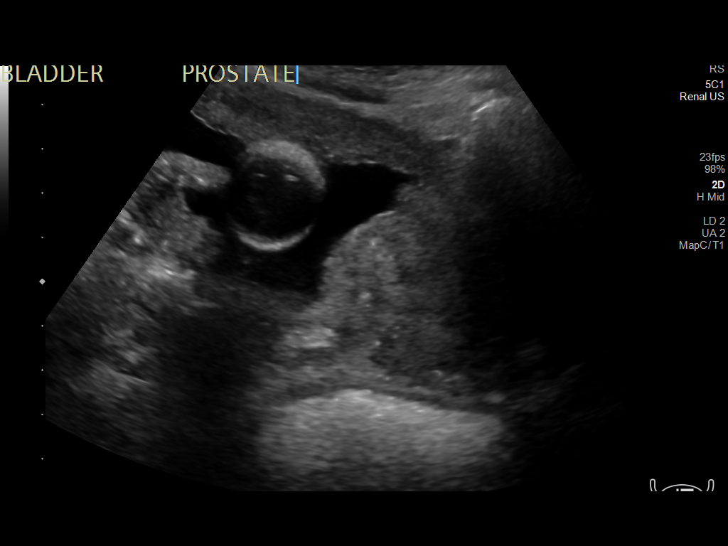

[13 of 25 positions shown; findings below may reference images not displayed]

FINDINGS: Right Kidney:

Renal measurements: 10.5 x 7.3 x 5.2 cm = volume: 209 mL. Mild to
moderate hydronephrosis. The renal parenchyma is echogenic.

Left Kidney:

Renal measurements: 11.9 x 6.5 x 6.7 cm = volume: 271 mL. Moderate
to severe hydronephrosis. The renal parenchyma is echogenic in
appearance. An echogenic structure with posterior acoustic shadowing
measuring up to 0.8 cm is identified in the lower pole collecting
system. There is positive color comet tail artifact. This is
consistent with nephrolithiasis. Circumscribed anechoic lesion with
minimal internal debris present in the interpolar region measuring
1.7 x 1.4 by 1.6 cm.

Bladder:

A Foley catheter is present within the bladder. The bladder wall
appears diffusely thickened with multifocal punctate echogenic foci.
Additionally, the prostate gland is markedly enlarged.

Other:

None.
IMPRESSION: 1. Bilateral hydronephrosis, mild to moderate on the right and
moderate to severe on the left. This suggests bilateral distal
obstruction. Recommend further evaluation with CT scan of the
abdomen and pelvis.
2. Abnormal appearance of the bladder which is diffusely thick
walled. The wall also appears irregular and contains multifocal
punctate echogenic foci. Differential considerations include chronic
trabeculation related to bladder outlet obstruction, chronic
inflammatory cystitis, and bladder malignancy. Cystoscopy could
further evaluate if clinically warranted.
3. Marked prostatomegaly.
4. Echogenic renal parenchyma bilaterally consistent with underlying
medical renal disease.

## 2021-06-19 IMAGING — CT CT ABD-PELV W/O CM
2 of 4 series · 16 of 46 positions shown, 18 images · non-contrast
Comparison: None.

CLINICAL DATA: Hematuria.

EXAM:
CT ABDOMEN AND PELVIS WITHOUT CONTRAST
TECHNIQUE: Multidetector CT imaging of the abdomen and pelvis was performed
following the standard protocol without IV contrast.

[Series 3: stone study 5.0 i30f 2 · axial · 0.72mm/px · z∈[+868,+1268]mm · 13 of 88 slices shown, 15 images]
[im 4/88  soft-tissue]
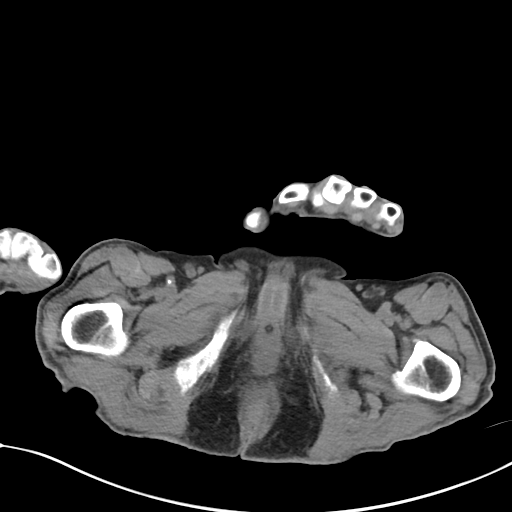
[im 4/88  bone]
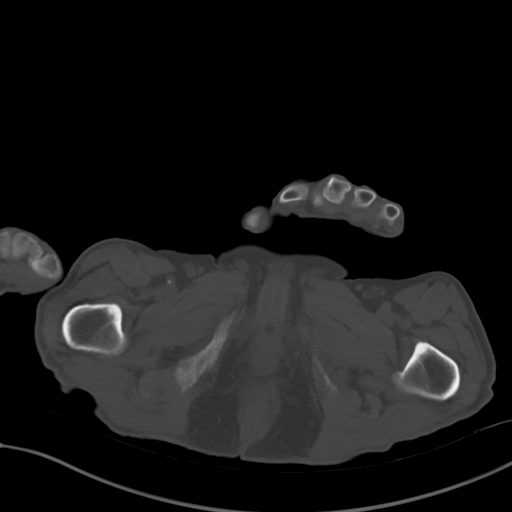
[im 11/88  soft-tissue]
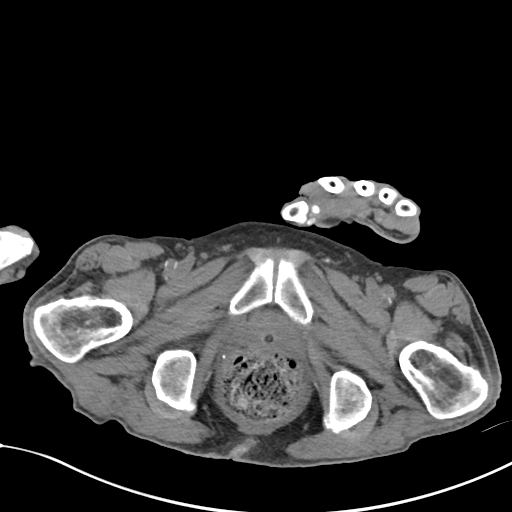
[im 19/88  soft-tissue]
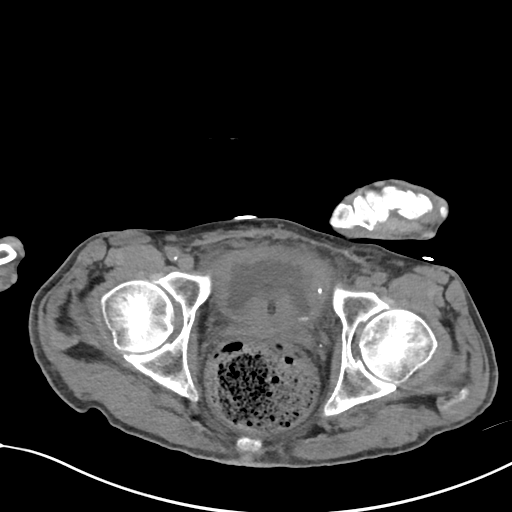
[im 26/88  soft-tissue]
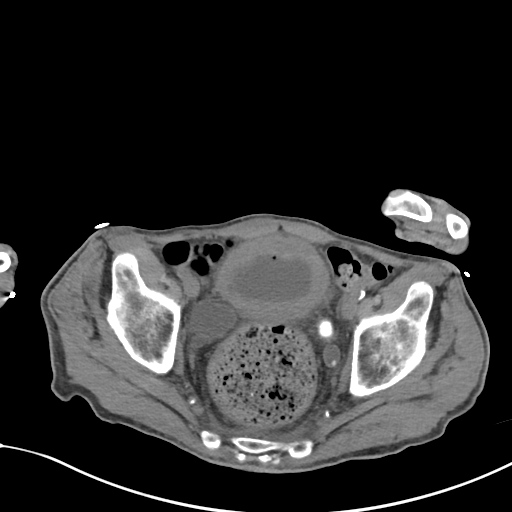
[im 30/88  soft-tissue]
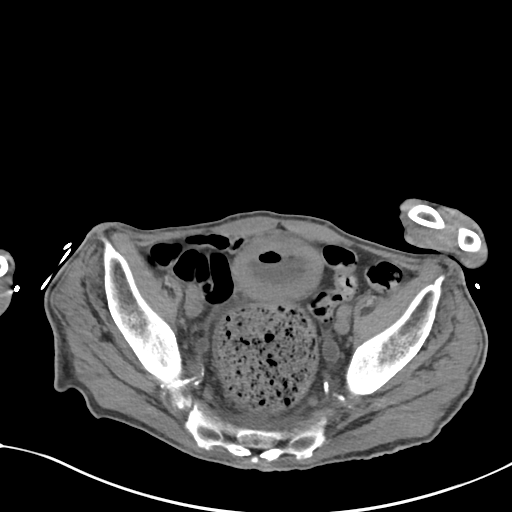
[im 37/88  soft-tissue]
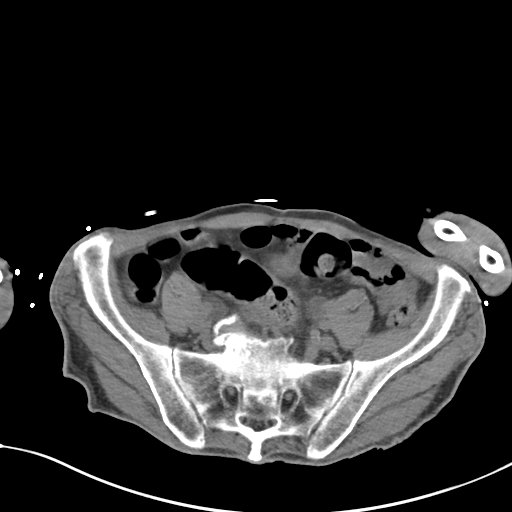
[im 44/88  soft-tissue]
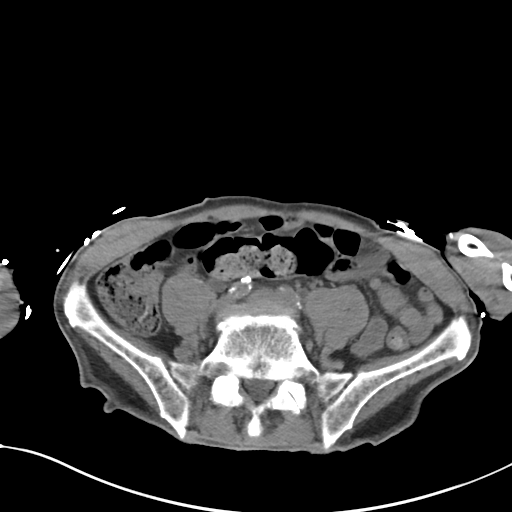
[im 51/88  soft-tissue]
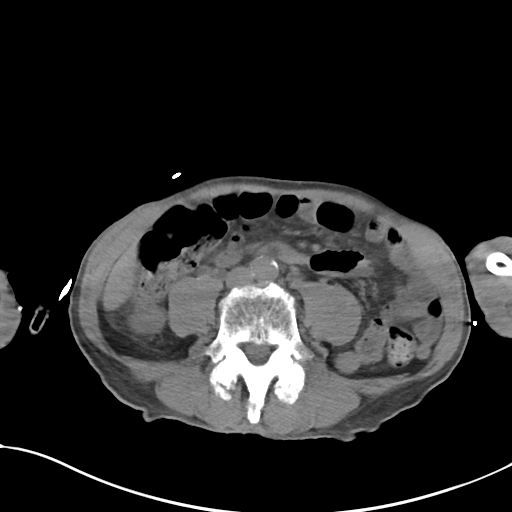
[im 59/88  soft-tissue]
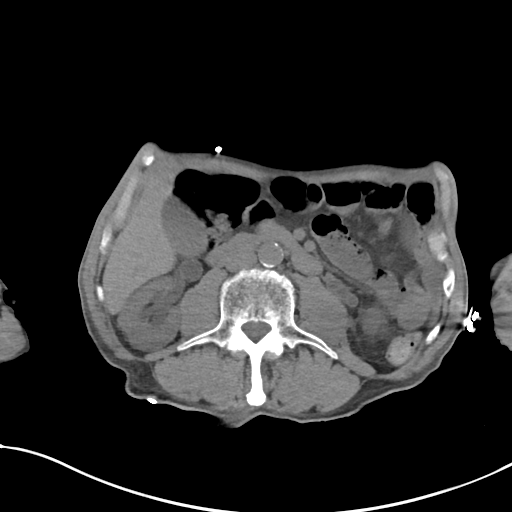
[im 59/88  bone]
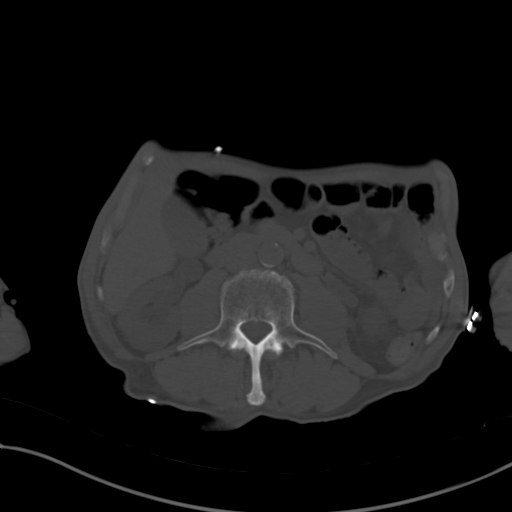
[im 62/88  soft-tissue]
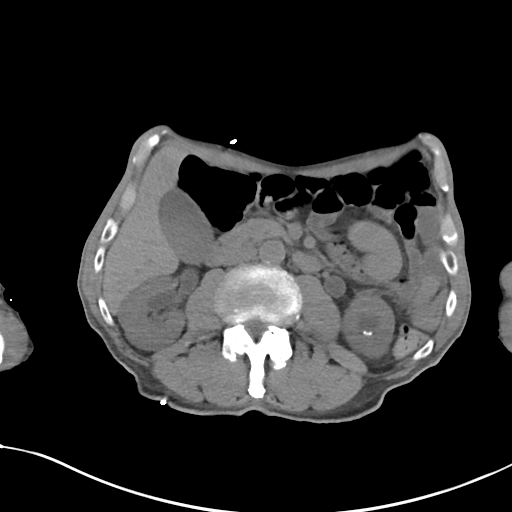
[im 69/88  soft-tissue]
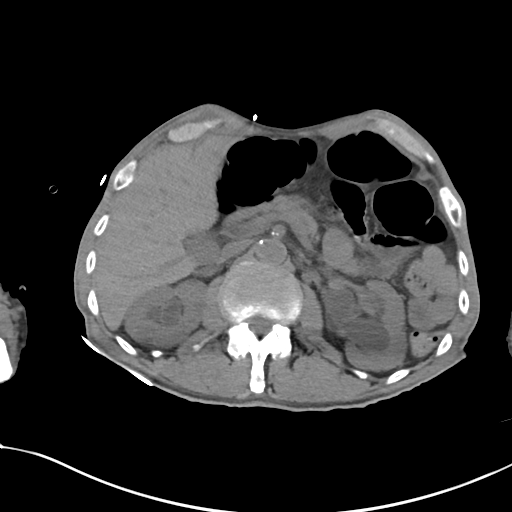
[im 77/88  soft-tissue]
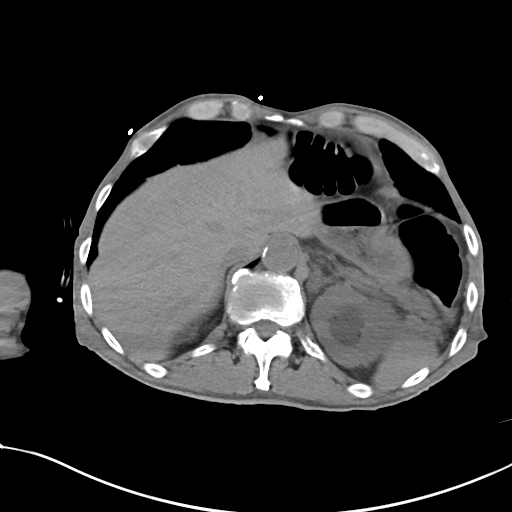
[im 84/88  soft-tissue]
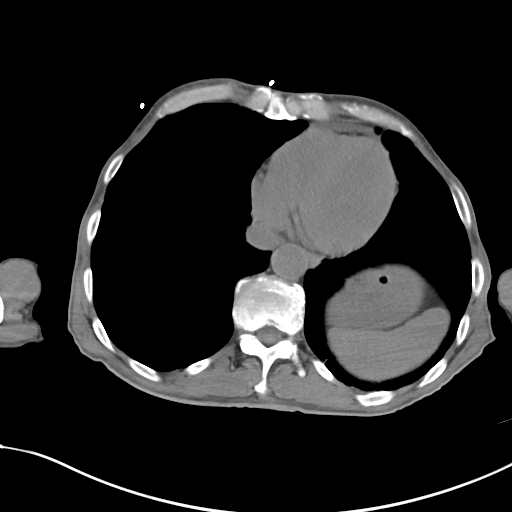

[Series 6: coronal soft tissue · coronal · 0.76mm/px · 3 of 88 slices shown]
[im 30/88  soft-tissue]
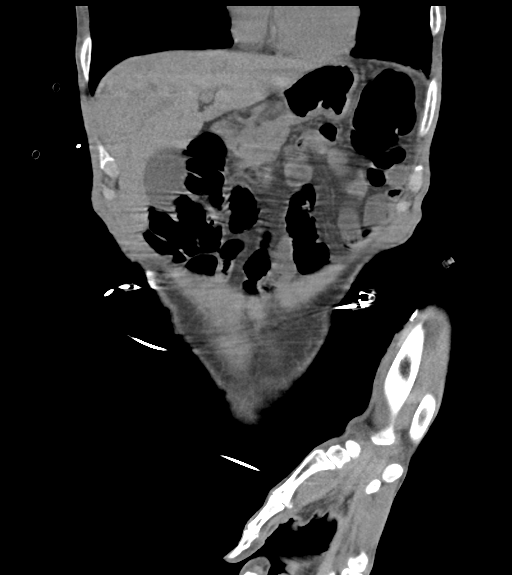
[im 39/88  soft-tissue]
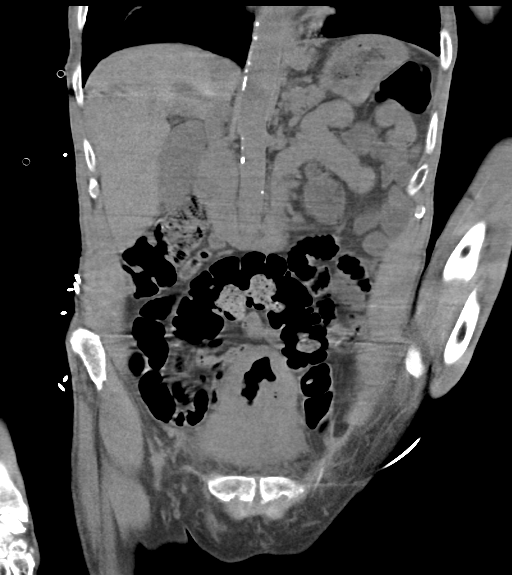
[im 49/88  soft-tissue]
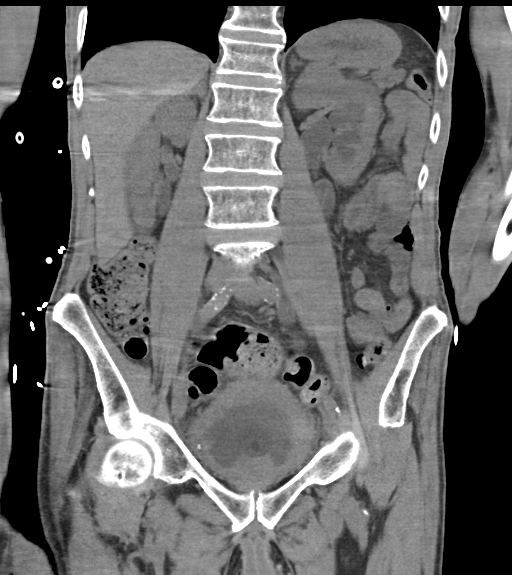

[16 of 46 positions shown; findings below may reference images not displayed]

FINDINGS: Lower chest: No acute abnormality.

Hepatobiliary: No focal liver abnormality is seen. No gallstones,
gallbladder wall thickening, or biliary dilatation.

Pancreas: Unremarkable. No pancreatic ductal dilatation or
surrounding inflammatory changes.

Spleen: Normal in size without focal abnormality.

Adrenals/Urinary Tract: Adrenal glands appear normal. Nonobstructive
left renal calculi are noted. Moderate left hydroureteronephrosis is
noted which appears to be due to 2.5 cm calculus at the left
ureterovesical junction. Two smaller calculi are seen more
proximally in the distal left ureter, the largest measuring 8 mm.
Mild right hydroureteronephrosis is noted with bulbous dilatation of
the ureter proximal to the ureterovesical junction. Severe diffuse
wall thickening of the urinary bladder is noted concerning for
cystitis. Foley catheter is noted. Several small calculi are noted
in the dependent portion of the urinary bladder suggesting recently
passed stones.

Stomach/Bowel: The stomach is unremarkable. There is no evidence of
bowel obstruction or inflammation. The appendix appears normal.
Large amount of stool is seen in the rectum concerning for
impaction.

Vascular/Lymphatic: Aortic atherosclerosis. No enlarged abdominal or
pelvic lymph nodes.

Reproductive: Mild prostatic enlargement is noted.

Other: No abdominal wall hernia or abnormality. No abdominopelvic
ascites.

Musculoskeletal: No acute or significant osseous findings.
IMPRESSION: 1. Nonobstructive left renal calculi.
2. Moderate left hydroureteronephrosis is noted which appears to be
due to 2.5 cm calculus at the left ureterovesical junction. Two
smaller calculi are seen more proximally in the distal left ureter,
the largest measuring 8 mm.
3. Mild right hydroureteronephrosis is noted with bulbous dilatation
of the ureter proximal to the ureterovesical junction.
4. Severe diffuse wall thickening of the urinary bladder is noted
concerning for cystitis. Several small calculi are noted in the
dependent portion of the urinary bladder suggesting recently passed
stones.
5. Mild prostatic enlargement.
6. Large amount of stool is seen in the rectum concerning for
impaction.
7. Aortic atherosclerosis.

Aortic Atherosclerosis ([id number]).
